# Patient Record
Sex: Female | Born: 1940 | Race: Black or African American | Hispanic: No | Smoking: Never smoker
Health system: Southern US, Academic
[De-identification: ages and names within clinical notes are randomized; demographics above are authoritative.]

## PROBLEM LIST (undated history)

## (undated) DIAGNOSIS — I10 Essential (primary) hypertension: Secondary | ICD-10-CM

## (undated) DIAGNOSIS — E559 Vitamin D deficiency, unspecified: Secondary | ICD-10-CM

## (undated) DIAGNOSIS — E0590 Thyrotoxicosis, unspecified without thyrotoxic crisis or storm: Secondary | ICD-10-CM

## (undated) HISTORY — PX: HX HYSTERECTOMY: SHX81

## (undated) HISTORY — PX: HX GALL BLADDER SURGERY/CHOLE: SHX55

## (undated) HISTORY — DX: Vitamin D deficiency, unspecified: E55.9

## (undated) HISTORY — DX: Thyrotoxicosis, unspecified without thyrotoxic crisis or storm: E05.90

## (undated) HISTORY — DX: Essential (primary) hypertension: I10

---

## 1992-04-01 ENCOUNTER — Other Ambulatory Visit (HOSPITAL_COMMUNITY): Payer: Self-pay

## 2012-11-19 IMAGING — MG MAMMO DIGITAL SCREENING
1 series · 8 of 8 positions shown · non-contrast
Comparison: 

MAMMO DIGITAL SCREENING WITH CAD
Exam:  

Bilateral digital screening mammogram with CAD
INDICATION: Annual screening.

[R CC · right · 8 of 14 slices shown]
[im 1/14]
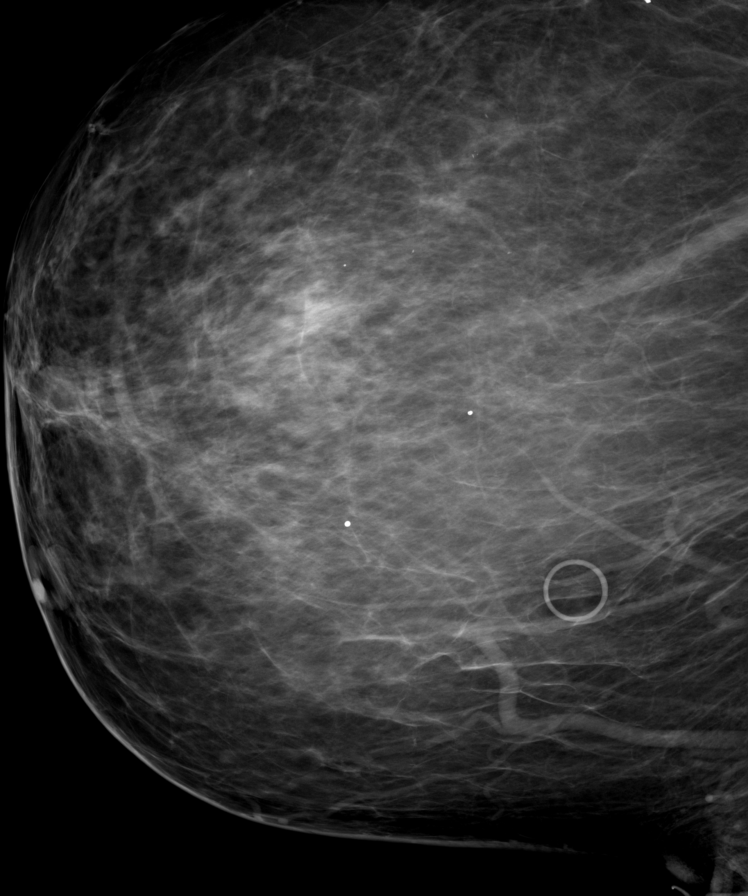
[im 2/14]
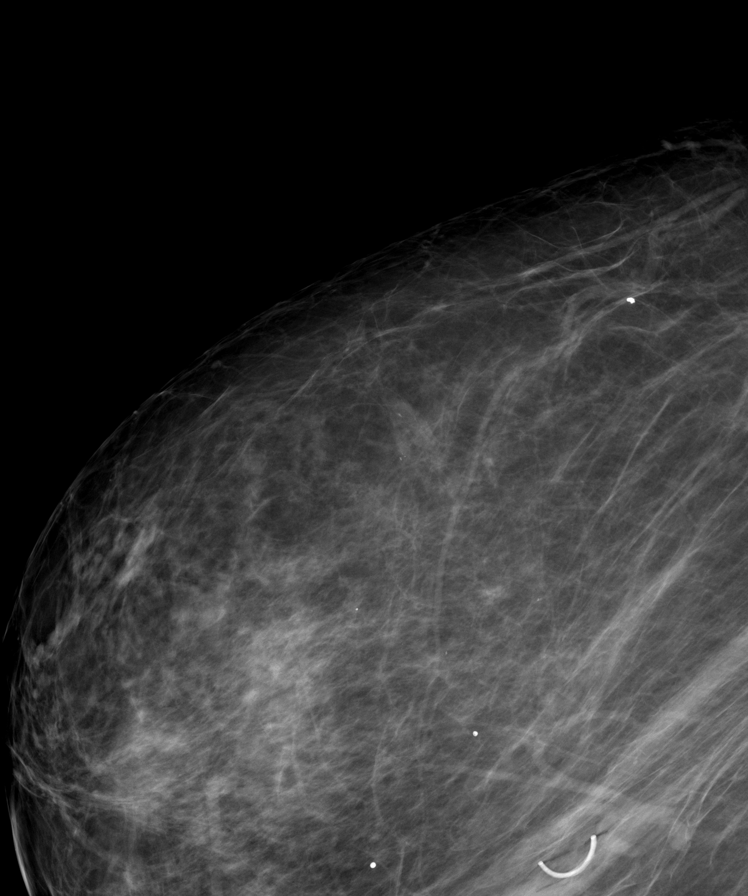
[im 4/14]
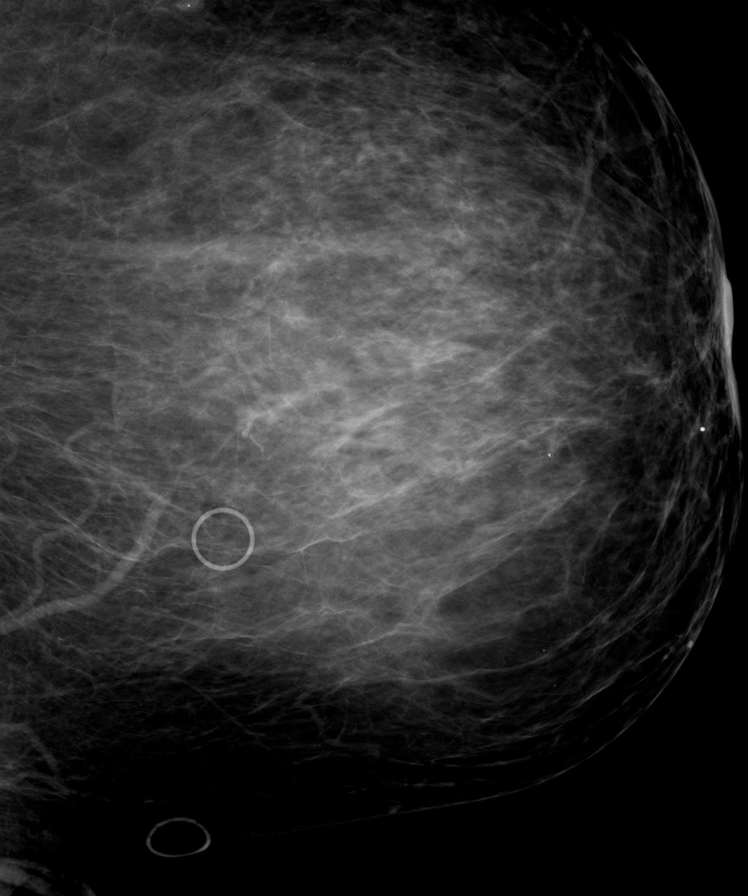
[im 6/14]
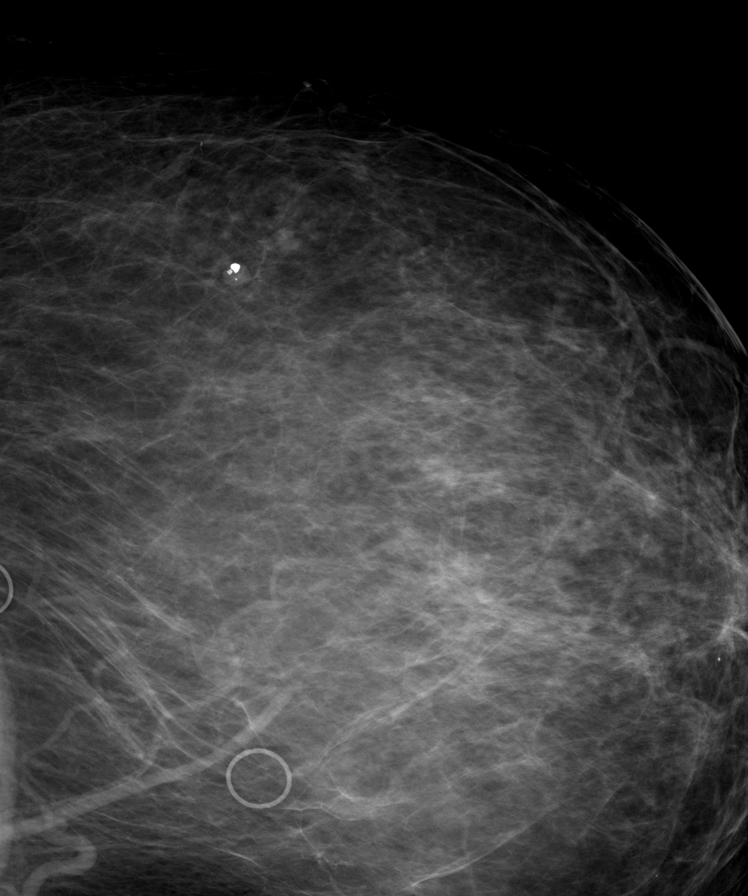
[im 8/14]
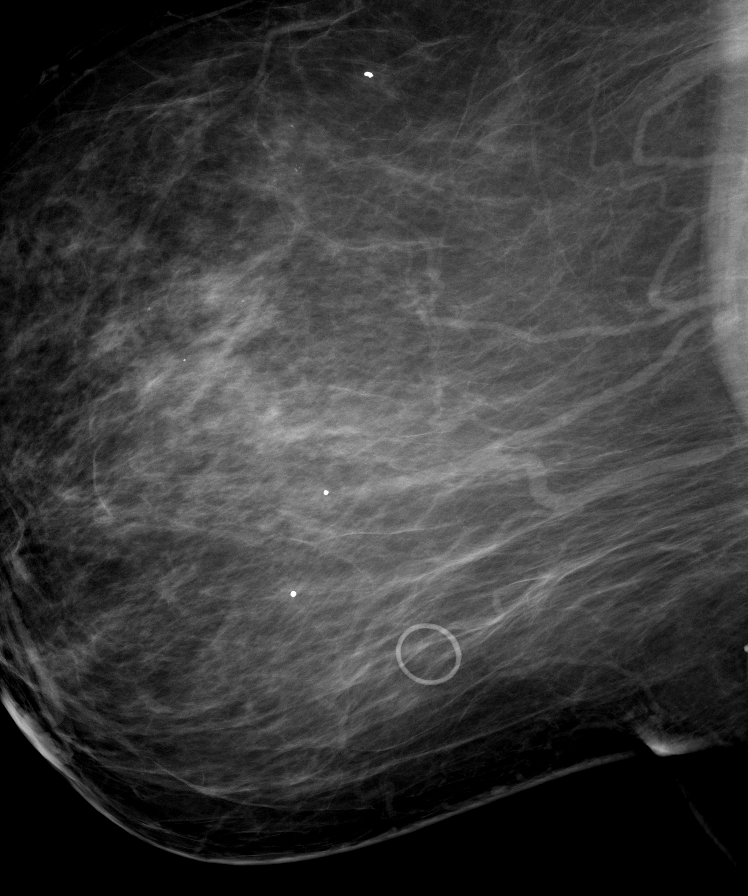
[im 10/14]
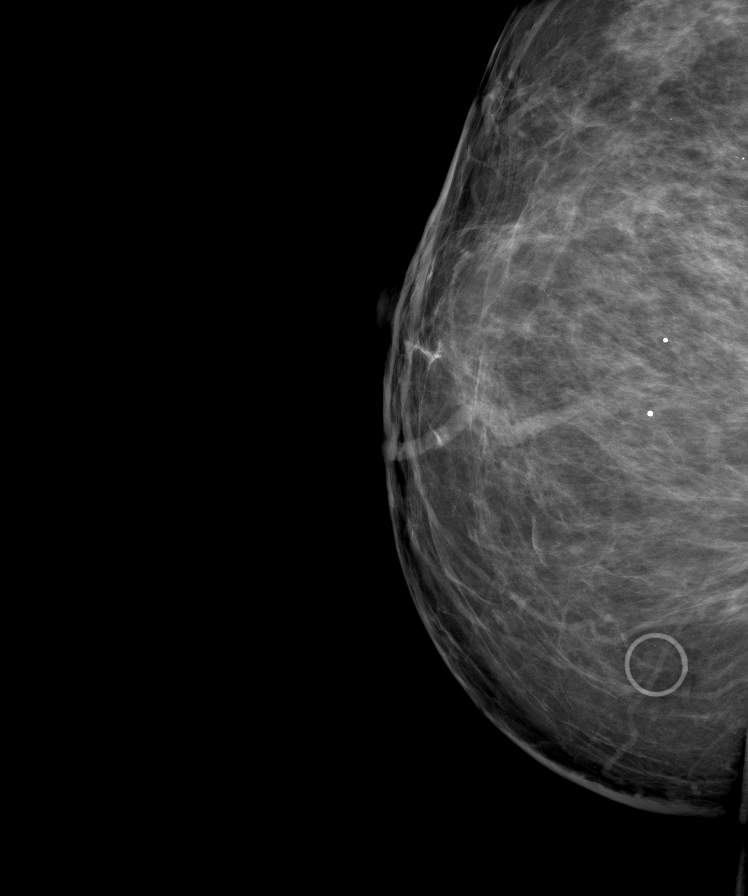
[im 12/14]
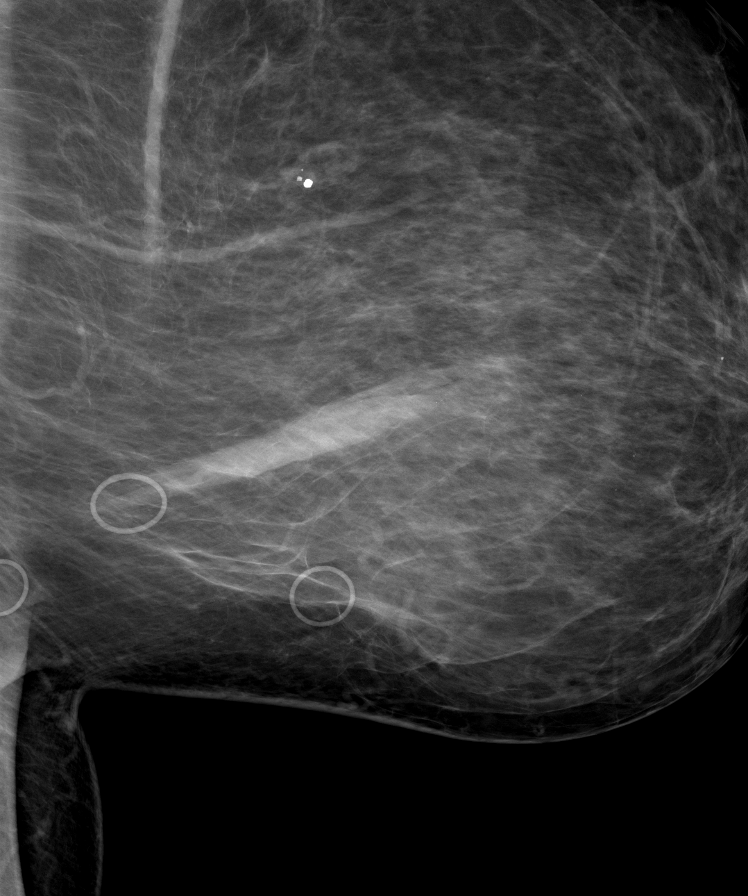
[im 14/14]
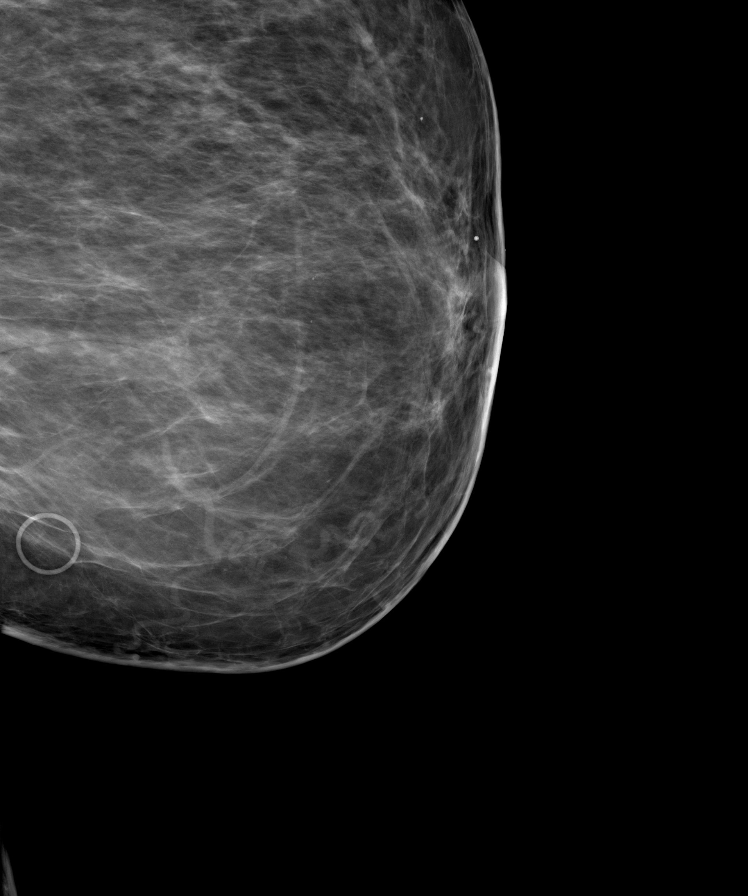

[8 of 8 positions shown; findings below may reference images not displayed]

FINDINGS: Breast parenchyma is predominantly fatty. There is no mass or suspicious cluster of microcalcifications. There is no architectural distortion, skin thickening or nipple retraction.
IMPRESSION: 1.
Bi-Rads 2-Benign findings. 
RECOMMENDATIONS: Annual screening mammogram as per ACR guidelines is recommended.  

Final Assessment Code:
Bi-Rads 2 

BI-RADS 0
Need additional imaging evaluation
BI-RADS 1
Negative mammogram
BI-RADS 2
Benign finding
BI-RADS 3
Probably benign finding: short-interval follow-up suggested
BI-RADS 4
Suspicious abnormality:  biopsy should be considered
BI-RADS 5
Highly suggestive of malignancy; appropriate action should be taken
BI-RADS 6
Known Biopsy-proven Malignancy  Appropriate action should be taken
NOTE:
In compliance with Federal regulations, the results of this mammogram are being sent to the patient.

## 2015-08-19 DIAGNOSIS — I722 Aneurysm of renal artery: Secondary | ICD-10-CM

## 2017-08-27 IMAGING — MG 3D SCREENING MAMMO BIL W/CAD
3 of 5 series · 6 of 16 positions shown · non-contrast
Comparison: 08/16/2016

------------- REPORT [id number] -------------
PATIENT REGISTRATION FORM

Scheduled Modality:
MG
Date Scheduled:
Referring Physician: 
ONESIMO ROHRER,FNP   Phone: [phone]
PATIENT INFORMATION
 Mr.
 Mrs.
 Miss  Ms.
Email address:
Social Security:  
Age:
Sex:
F
Mailing Address:
Home Phone
Cell Phone 
Study Type:
Diagnosis / Symptoms:
Insurance Authorization:
PREV, RONTA, PT HAS ORDER, MCARE
Notes/Special Instructions: 
INSURANCE INFORMATION
(Please give your insurance card to the receptionist.)
Name of  primary insurance
[employer] [place]
Subscriber’s S.S. #
Group #
Insurance ID # 
Co-payment:
$
Patient’s relationship to subscriber:
 Self
 Spouse
 Child
 Other
Name of secondary insurance (if applicable):
Insurance ID #
IN CASE OF EMERGENCY
Name of friend or relative to call in case of emergency:
Relationship to patient:
Home phone #
Work phone #
[employer] SAME #
The above information is true to the best of my knowledge. I authorize my insurance benefits be paid directly to the physician. I understand that I am financially responsible for any balance. I also authorize [employer] or insurance company to release any information required to process my claims.
Patient/Guardian signature
Date
------------- REPORT [id number] -------------
EXAM:  3D BILATERAL ANNUAL SCREENING DIGITAL MAMMOGRAM WITH TOMOSYNTHESIS AND CAD
INDICATION: Screening.

[Series 6347: R CC · right · 0.10mm/px · 4 of 5 slices shown]
[im 1/5]
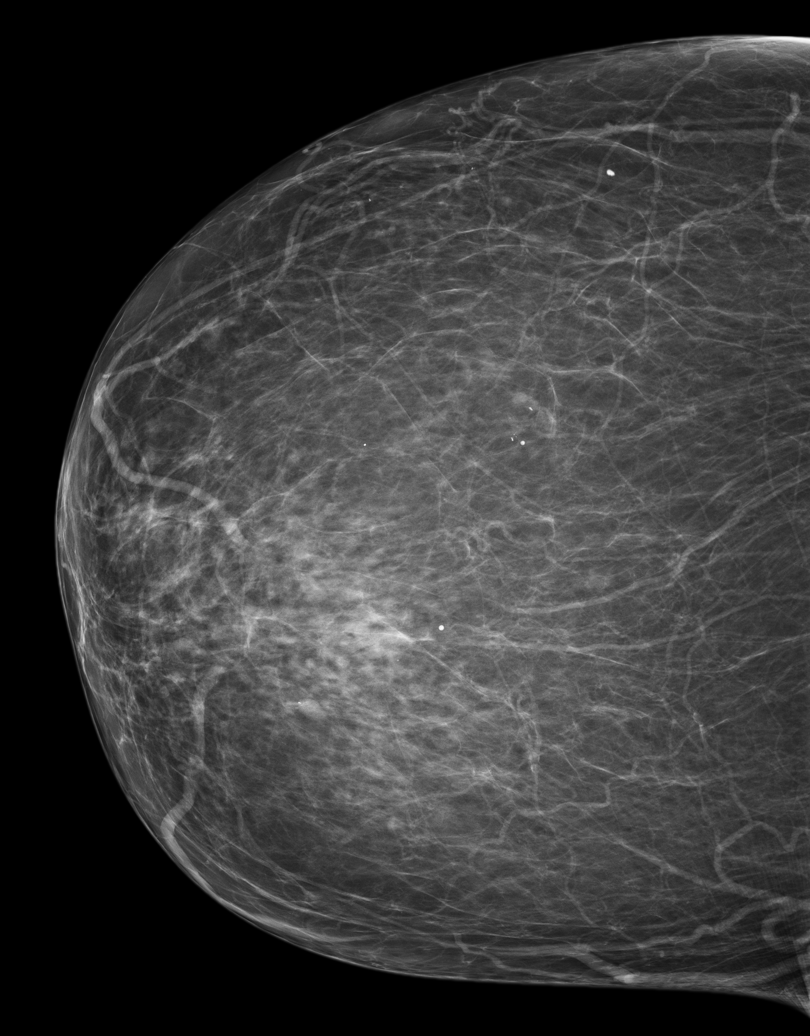
[im 2/5]
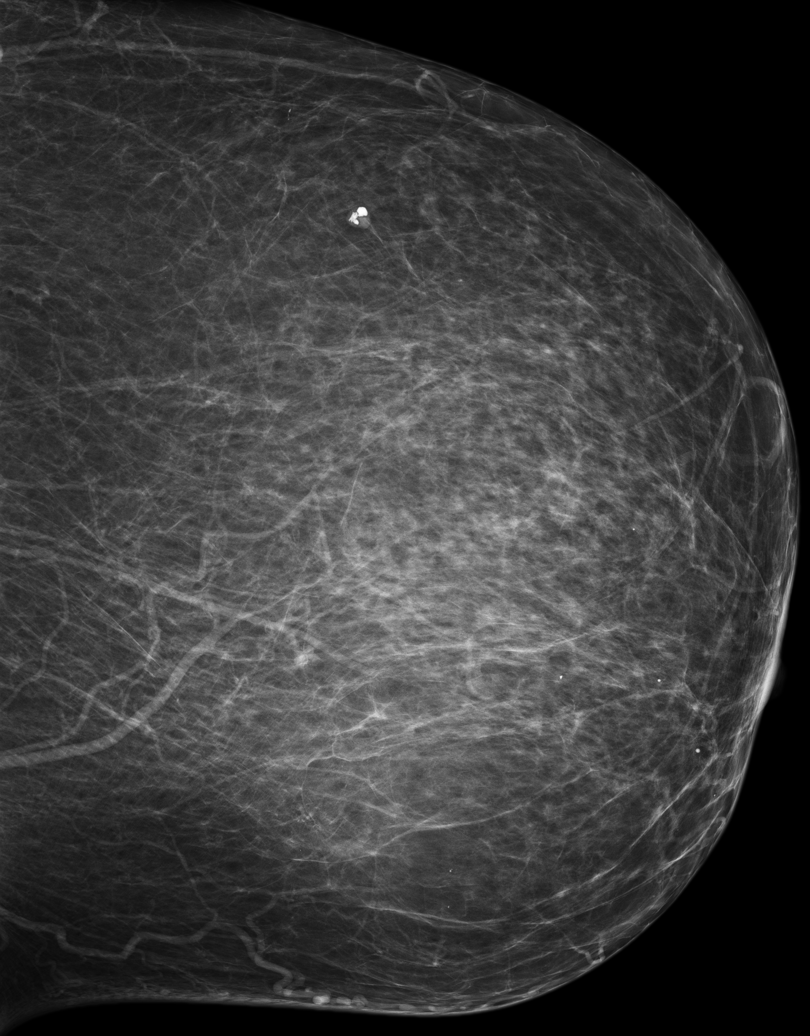
[im 3/5]
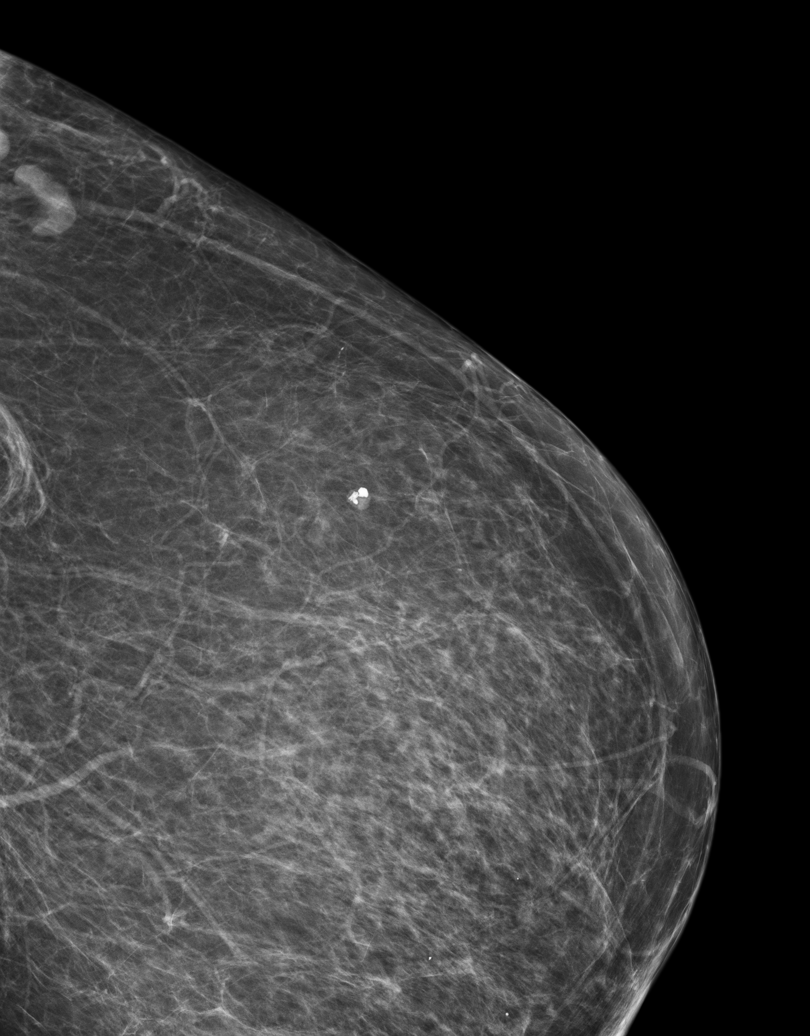
[im 5/5]
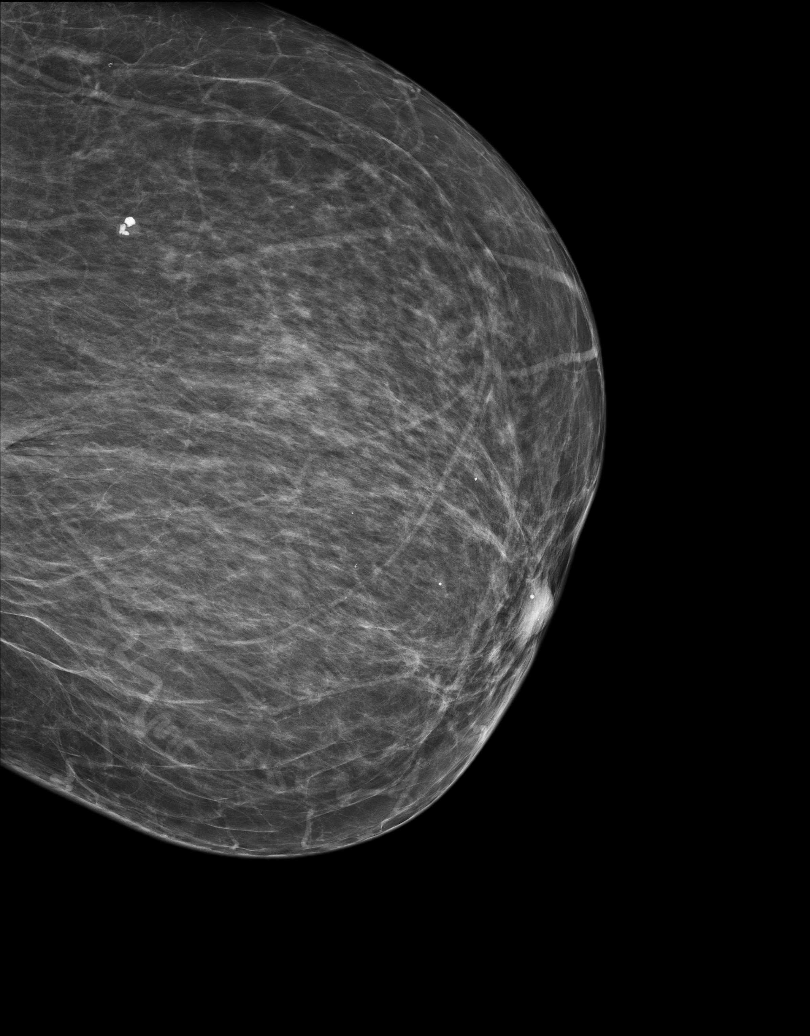

[R]
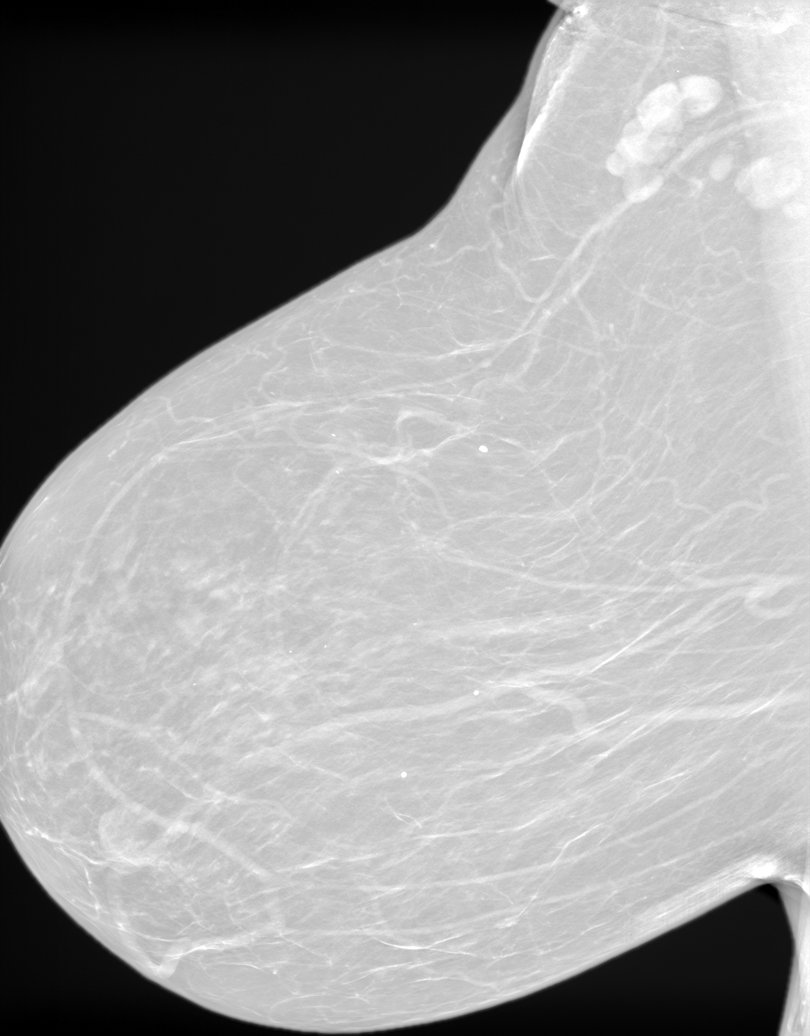

[L]
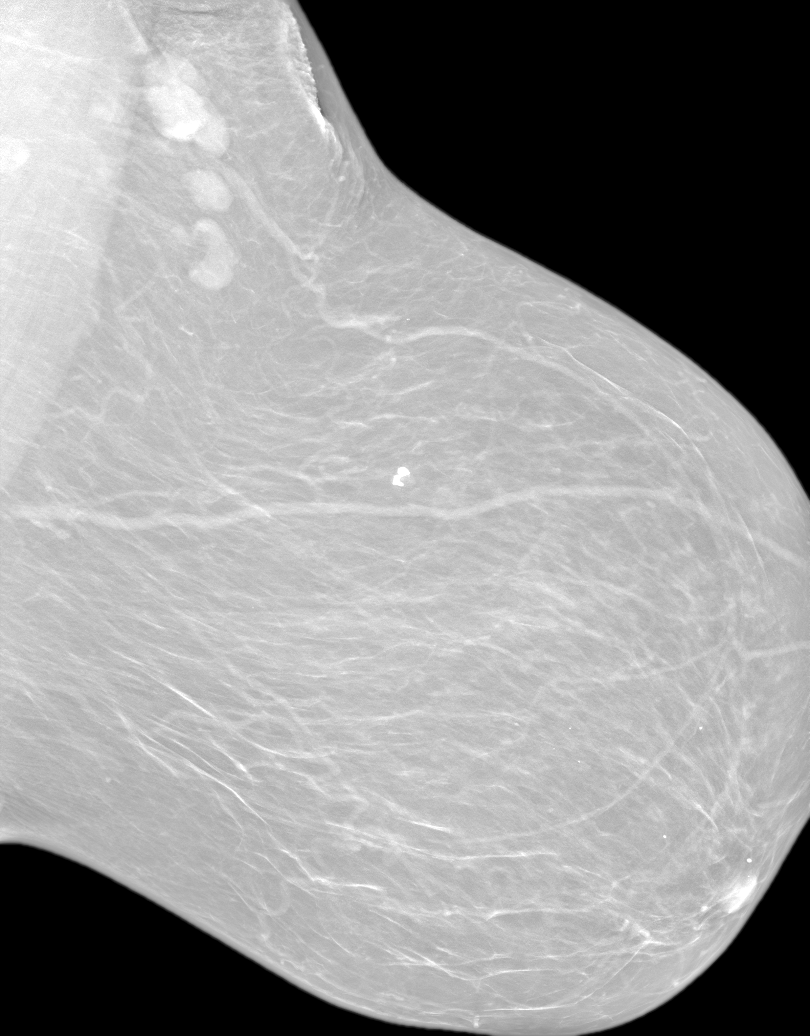

[6 of 16 positions shown; findings below may reference images not displayed]

FINDINGS: A screening mammogram is performed.  The current exam is compared with an earlier study dated 08/16/2016.  There is involutional changes consisting of fatty replacement of the glandular tissue bilaterally.  There are coarse benign calcifications bilaterally.  3D tomosynthesis shows no evidence for an occult mass or calcifications.  There are numerous lymph nodes noted in both axillary areas.
IMPRESSION: 1.  Normal stable screening mammogram. 

2.  ACR BREAST DENSITY:  B (Scattered areas of fibroglandular density). 

3.  BIRADS 2-Benign findings. Patient has been added in a reminder system with a target date for the next screening mammography.

4.  Management recommendation:  Routine mammographic screening.   

Final Assessment Code:

Bi-Rads 2 

BI-RADS 0
Need additional imaging evaluation

BI-RADS 1
Negative mammogram

BI-RADS 2
Benign finding

BI-RADS 3
Probably benign finding: short-interval follow-up suggested

BI-RADS 4
Suspicious abnormality:  biopsy should be considered

BI-RADS 5
Highly suggestive of malignancy; appropriate action should be taken

BI-RADS 6
Known Biopsy-proven Malignancy – Appropriate action should be taken

NOTE:
In compliance with Federal regulations, the results of this mammogram are being sent to the patient.

------------- REPORT [id number] -------------
[facility]
[address]
We wish to report the following on your recent mammography examination. We are sending a report to your referring physician or other health care provider. 
(       Normal/Negative:
No evidence of cancer.
This statement is mandated by the [place], Department of Health.
Your examination was performed by one of our technologists, who are registered radiological technologists and also specially certified in mammography:
___
Frank, Zarina (M)
___
Huidrom, Rutik (M)

Your mammogram was interpreted by our radiologist.

( 
Jean Rousso Aniece, M.D.

(Annual Breast Examination by a physician or other health care provider
(Annual Mammography Screening beginning at age 40
(Monthly Breast Self Examination

## 2019-12-18 IMAGING — MR MRI JOINT UPPER EXTREMITY WITHOUT CONTRAST RT
8 series · 40 of 40 positions shown · IV contrast (gadolinium)
Comparison: None.

﻿EXAM:  MRI JOINT UPPER EXTREMITY WITHOUT CONTRAST RT SHOULDER
INDICATION: Severe right shoulder pain.
TECHNIQUE: Multiplanar, multisequential MRI of the right shoulder joint was performed without gadolinium contrast.

[Series 4: axial shim · axial · right · 10.0mm · 3.12mm/px · z∈[-60,+60]mm · 4 of 13 slices shown (1 of 2)]
[im 1/13]
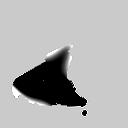
[im 5/13]
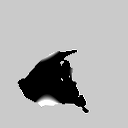
[im 9/13]
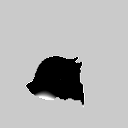
[im 13/13]
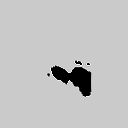

[Series 5: axial shim · axial · right · 10.0mm · 3.12mm/px · z∈[-60,+60]mm · 4 of 13 slices shown (2 of 2)]
[im 1/13]
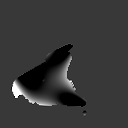
[im 5/13]
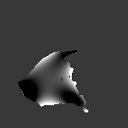
[im 9/13]
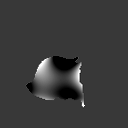
[im 13/13]
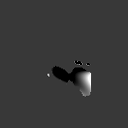

[Series 6: T1 · oblique · right · 3.5mm · 0.38mm/px · 5 of 18 slices shown]
[im 1/18]
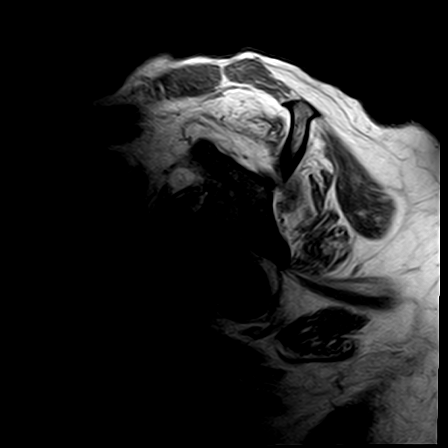
[im 5/18]
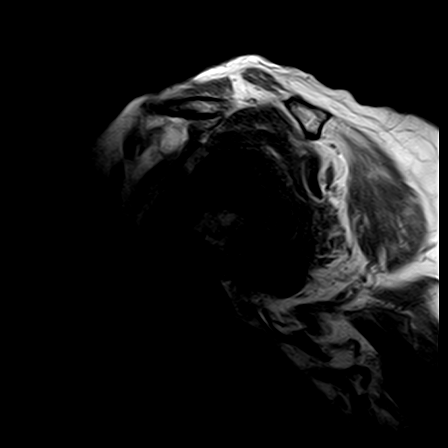
[im 9/18]
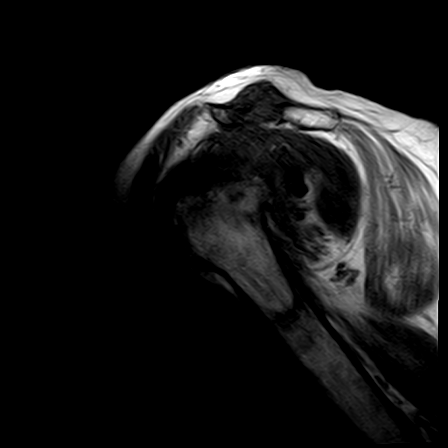
[im 13/18]
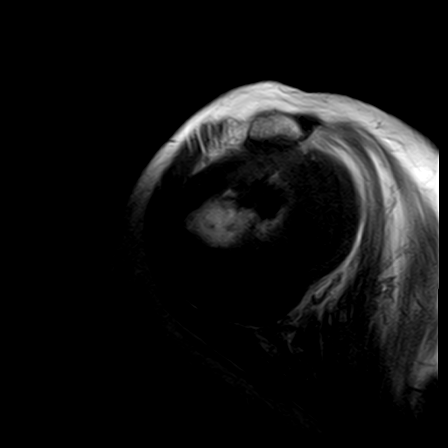
[im 18/18]
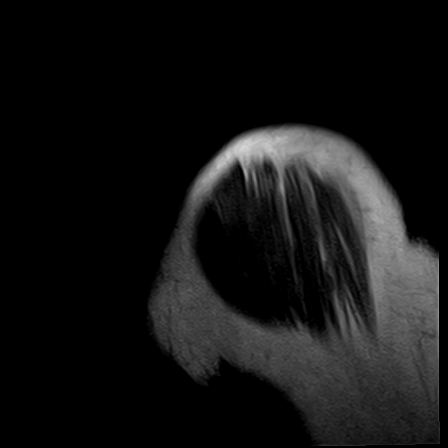

[Series 7: STIR · oblique · right · 3.5mm · 0.47mm/px · 5 of 18 slices shown (1 of 2)]
[im 1/18]
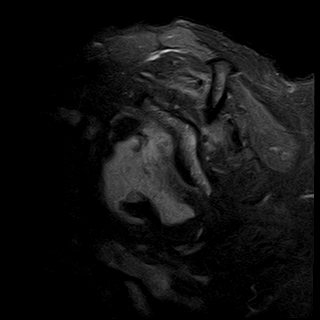
[im 5/18]
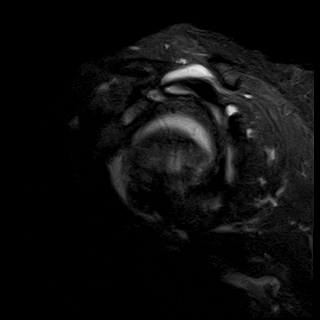
[im 9/18]
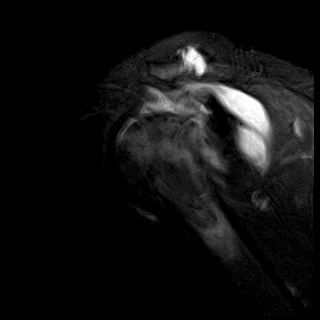
[im 13/18]
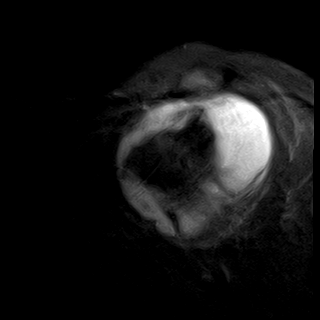
[im 18/18]
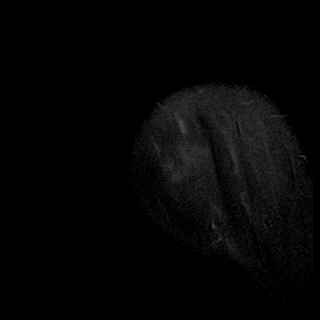

[Series 8: PD fat-sat · axial · right · 4.0mm · 0.56mm/px · z∈[-33,+44]mm · 5 of 18 slices shown (1 of 2)]
[im 1/18]
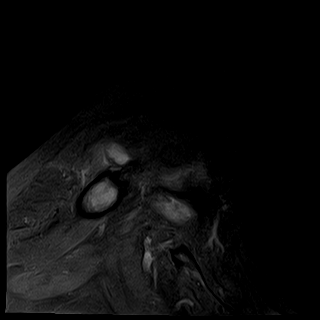
[im 5/18]
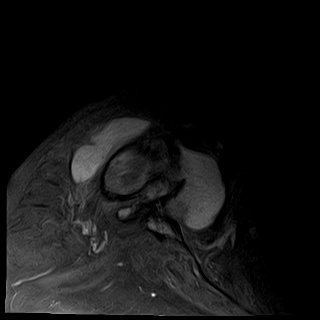
[im 9/18]
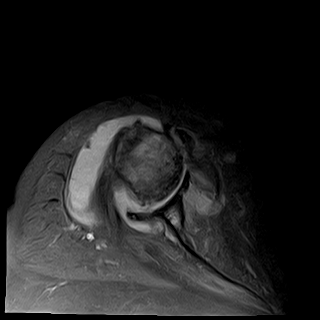
[im 13/18]
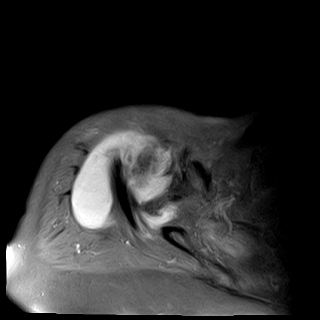
[im 18/18]
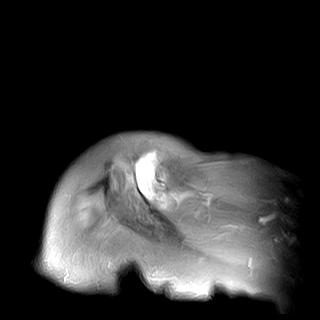

[Series 9: T2 fat-sat · axial · right · 4.0mm · 0.42mm/px · z∈[-46,+57]mm · 7 of 24 slices shown]
[im 1/24]
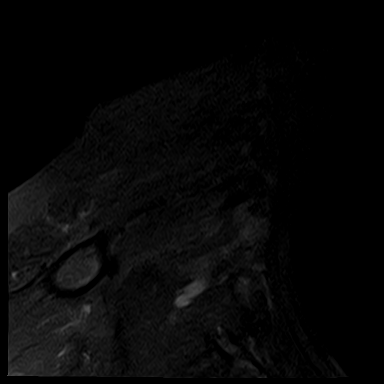
[im 4/24]
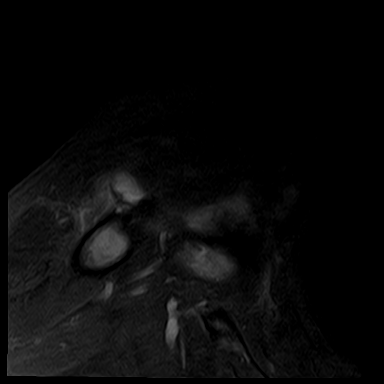
[im 8/24]
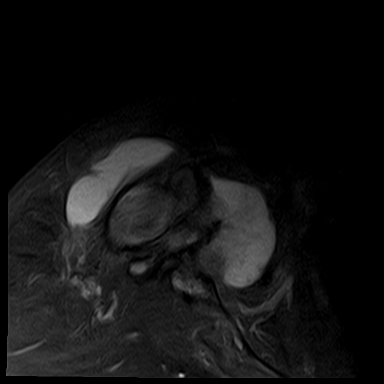
[im 12/24]
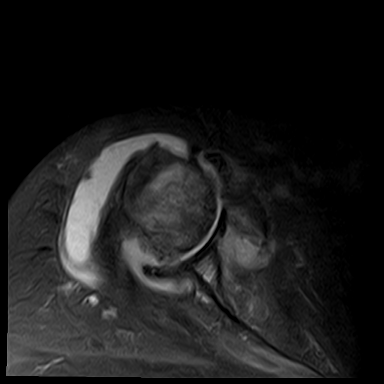
[im 16/24]
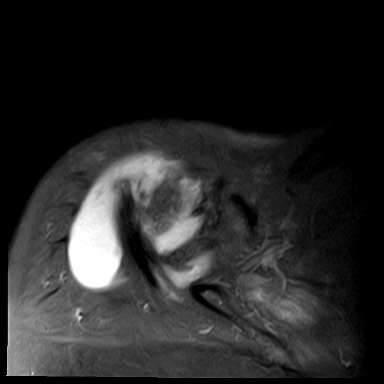
[im 20/24]
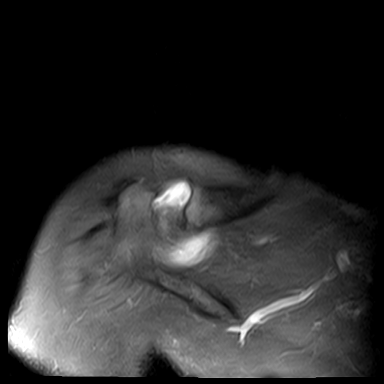
[im 24/24]
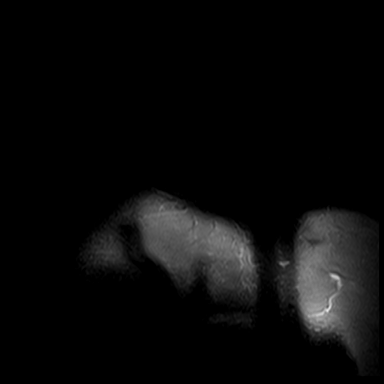

[Series 10: PD fat-sat · oblique · right · 4.0mm · 0.53mm/px · 5 of 18 slices shown (2 of 2)]
[im 1/18]
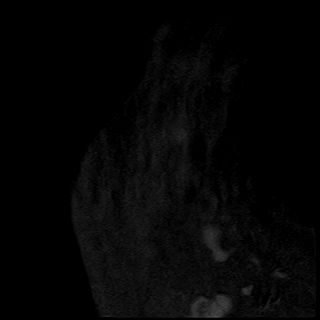
[im 5/18]
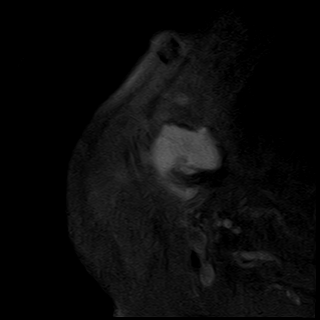
[im 9/18]
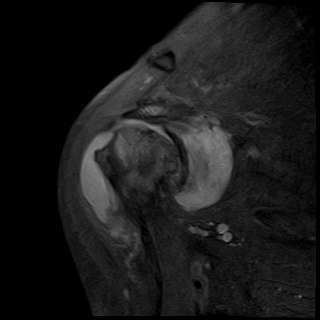
[im 13/18]
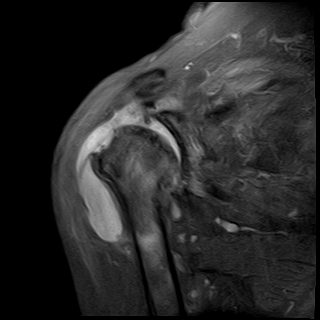
[im 18/18]
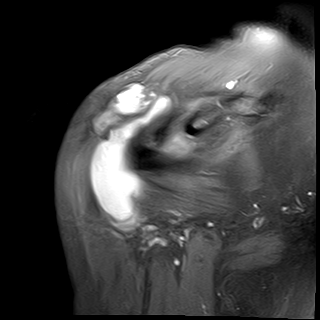

[Series 11: STIR · oblique · right · 4.0mm · 0.53mm/px · 5 of 18 slices shown (2 of 2)]
[im 1/18]
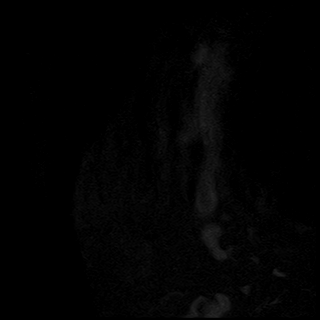
[im 5/18]
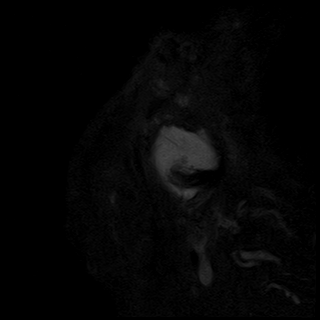
[im 9/18]
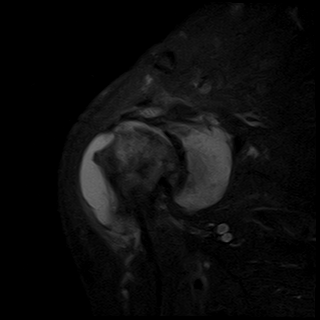
[im 13/18]
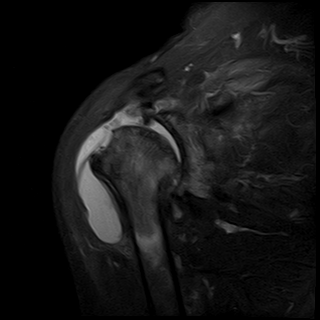
[im 18/18]
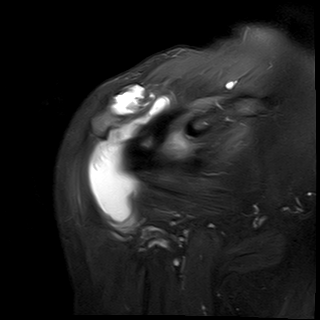

[40 of 40 positions shown; findings below may reference images not displayed]

FINDINGS: Examination is significantly degrade due to excessive patient motion.  There are full thickness supraspinatus, infraspinatus and subscapularis tendon tears.  Long head of the biceps tendon is also not well seen.  There is complete glenohumeral articular cartilage loss.  Extensive superior and posterior labral tears are also noted.  There is extensive edema within the humeral head with suggestion of a nondisplaced proximal humeral shaft fracture.  There is moderate acromioclavicular joint osteoarthritis.  There is a large glenohumeral joint effusion.  No mass is seen along the course of the suprascapular nerve, within the spinoglenoid notch or within the quadrilateral space.
IMPRESSION: 1. Significantly degrade exam due to excessive patient motion.  Large full thickness supraspinatus, infraspinatus and subscapularis tendon tears.  

2. Long head of the biceps tendon not well seen and may be chronically torn and retracted.  

3. Extensive superior and posterior labral tears with near complete glenohumeral articular cartilage loss.

4. Extensive edema within the humeral head with suggestion of a nondisplaced proximal humeral shaft fracture.  

5. Moderate acromioclavicular joint osteoarthritis.

## 2020-10-07 IMAGING — CT CT HEAD W/O CONTRAST
2 series · 16 of 30 positions shown, 20 images · non-contrast
Comparison: None available.

﻿EXAM:  CT HEAD W/O CONTRAST
INDICATION: Headaches and left-sided weakness.
TECHNIQUE: Axial noncontrast CT imaging was performed from skull base to the vertex. Exam was performed using 1 or more of the following dose reduction techniques: Automated exposure control, adjustment of the mA and/or kV according to patient size, or the use of iterative reconstruction technique.

[ax · axial · 0.42mm/px · z∈[-138,-2]mm · 13 of 80 slices shown, 17 images]
[im 6/80  brain]
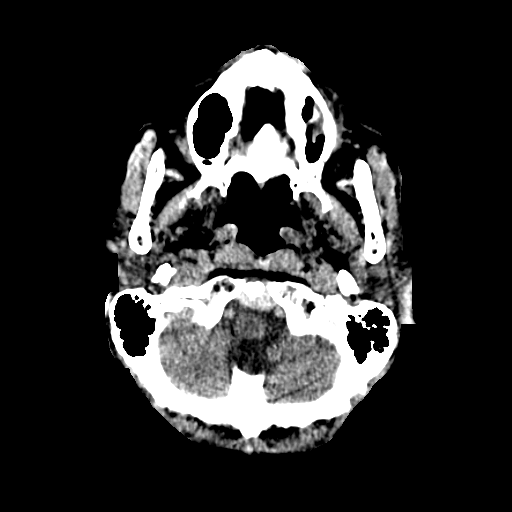
[im 6/80  bone]
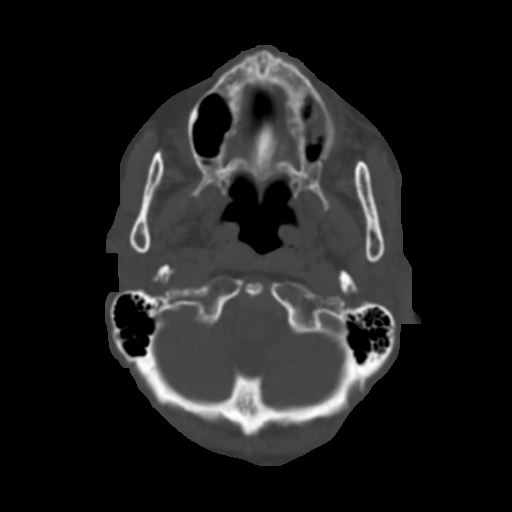
[im 12/80  brain]
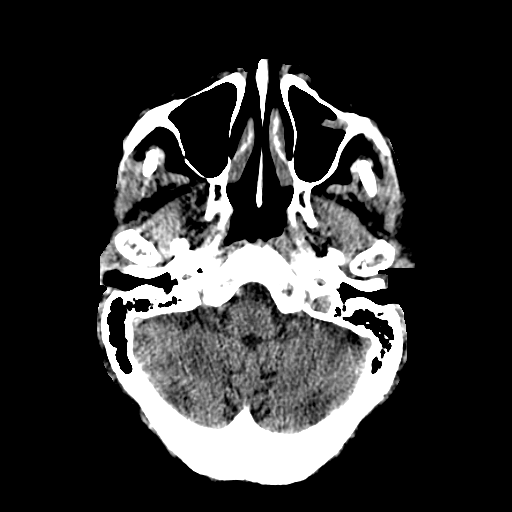
[im 17/80  brain]
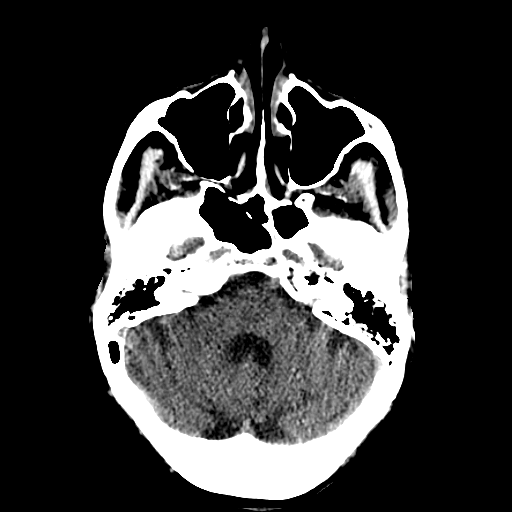
[im 23/80  brain]
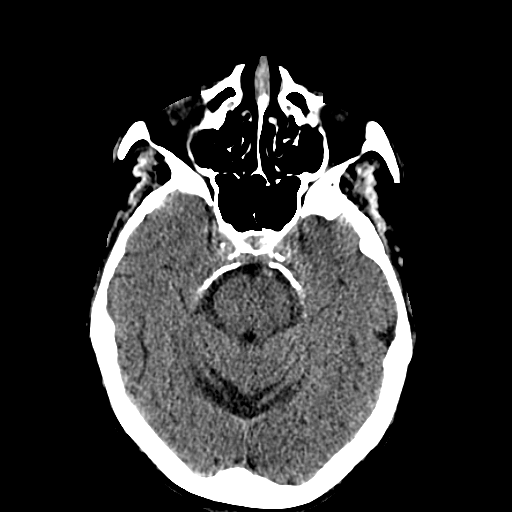
[im 29/80  brain]
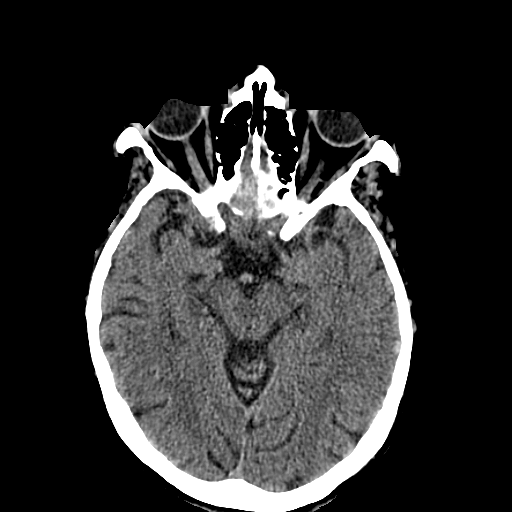
[im 29/80  bone]
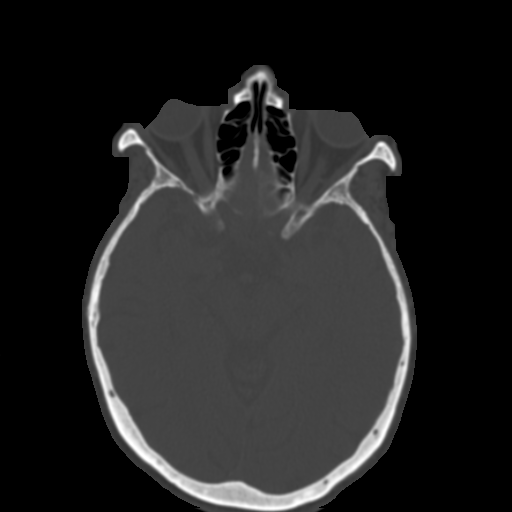
[im 34/80  brain]
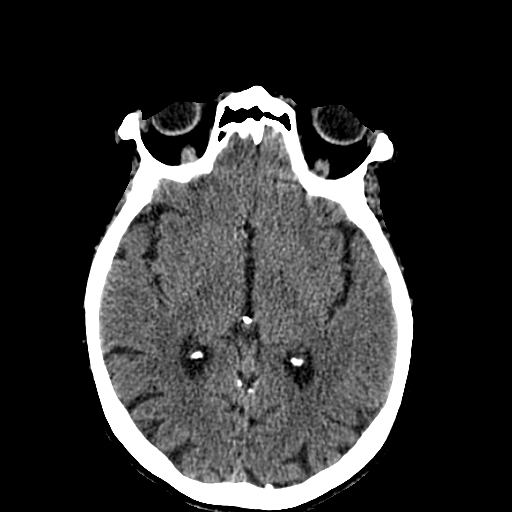
[im 40/80  brain]
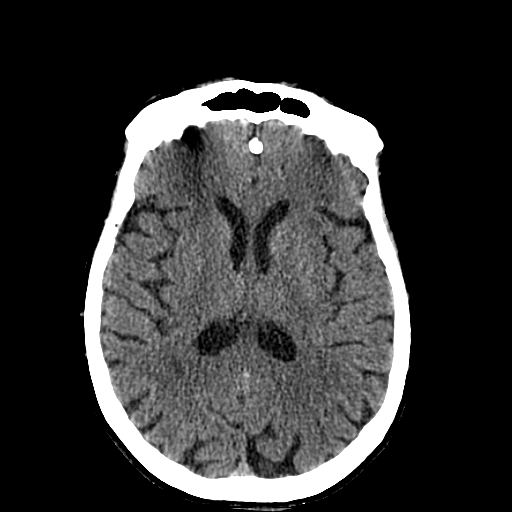
[im 46/80  brain]
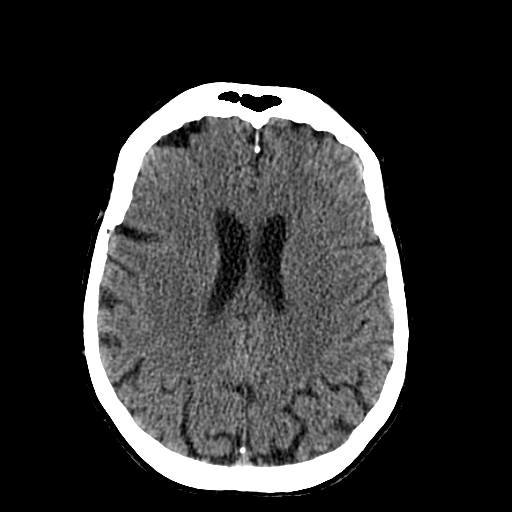
[im 51/80  brain]
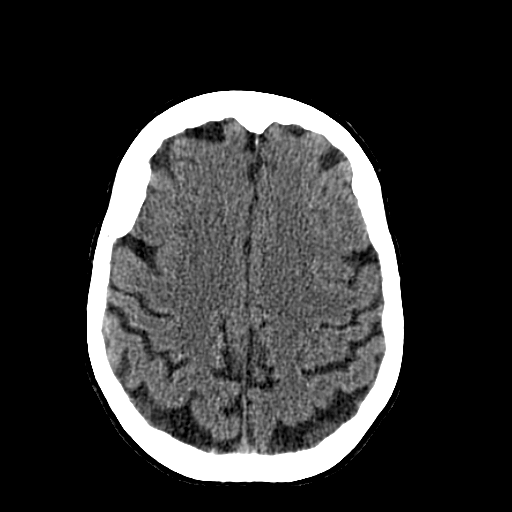
[im 51/80  bone]
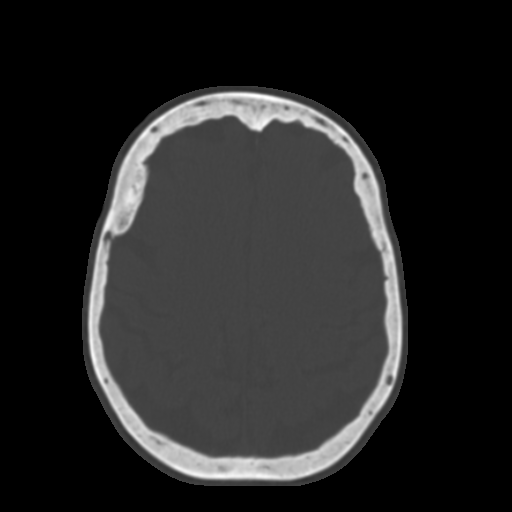
[im 57/80  brain]
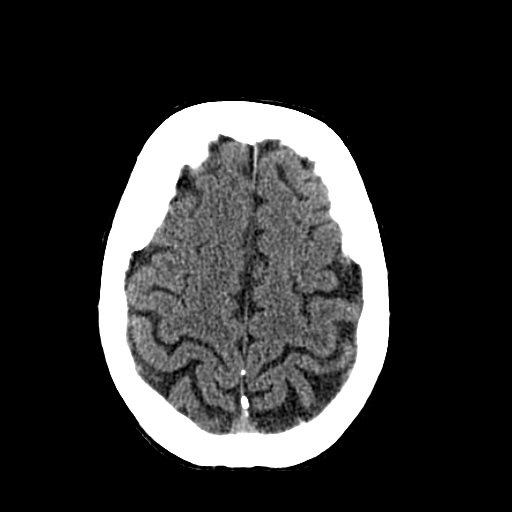
[im 63/80  brain]
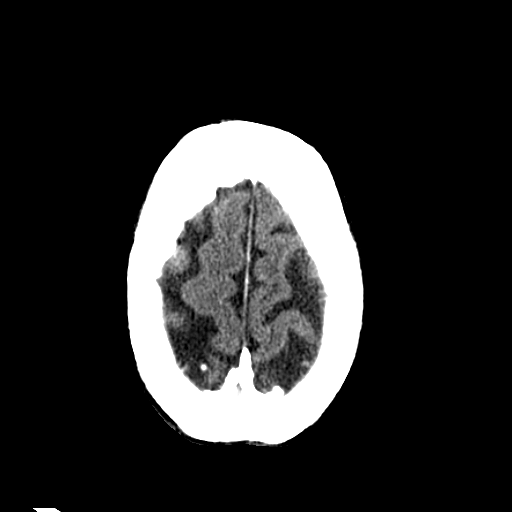
[im 68/80  brain]
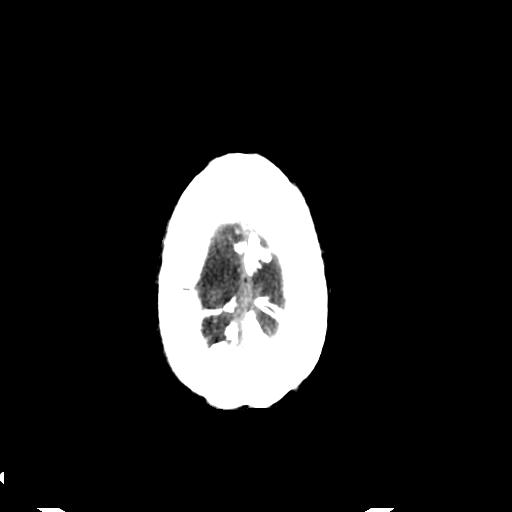
[im 74/80  brain]
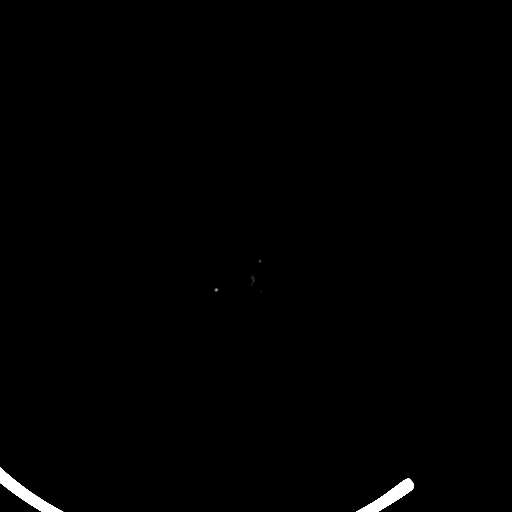
[im 74/80  bone]
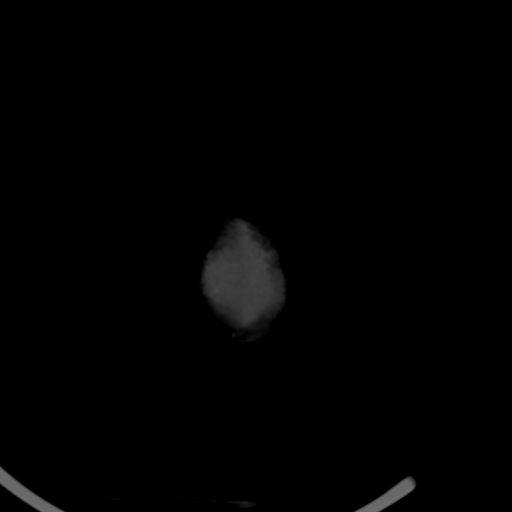

[bone · axial · 0.47mm/px · z∈[-150,-104]mm · 3 of 80 slices shown]
[im 6/80  bone]
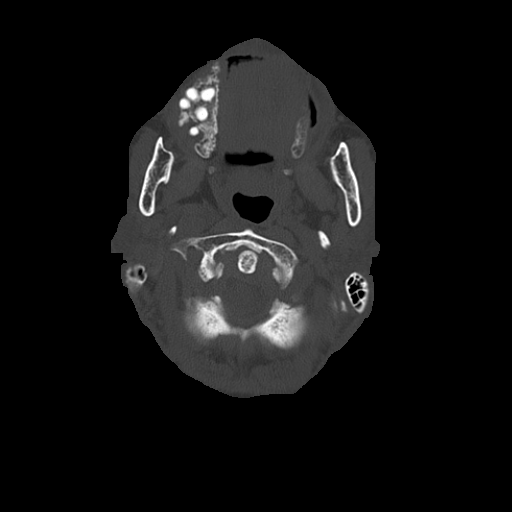
[im 17/80  bone]
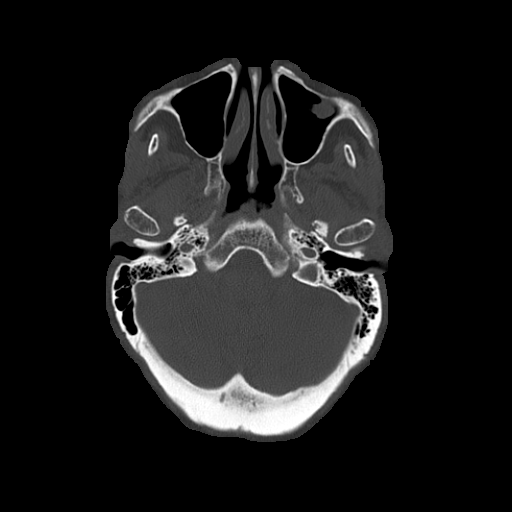
[im 29/80  bone]
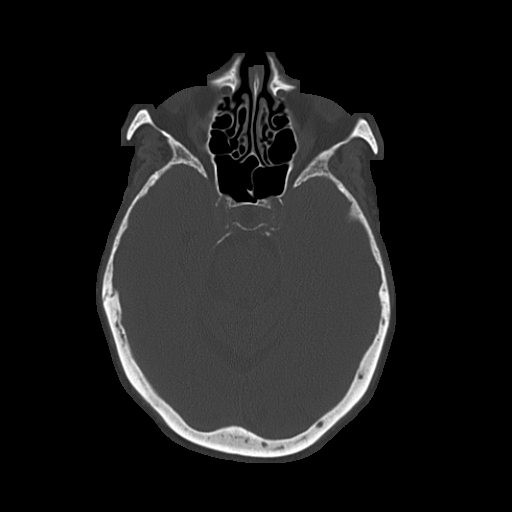

[16 of 30 positions shown; findings below may reference images not displayed]

FINDINGS: The cortical sulci and ventricular system is appropriate in size for the patient's age. There are no focal areas of abnormal attenuation within the brain parenchyma. There is no mass effect, midline shift or intracranial hemorrhage. There is no evidence of acute infarction. There are no extra-axial fluid collections. Visualized paranasal sinuses, mastoid air cells and orbital contents are also unremarkable.
IMPRESSION: Unremarkable exam.

## 2021-02-22 IMAGING — MR MRI LUMBAR SPINE WITHOUT CONTRAST
6 series · 48 of 48 positions shown · IV contrast (gadolinium)
Comparison: None available.

﻿EXAM:  [id number]   MRI LUMBAR SPINE WITHOUT CONTRAST
INDICATION: Chronic lower back.
TECHNIQUE: Multiplanar multisequential MRI of the lumbar spine was performed without gadolinium contrast. A non conventional body coil was utilized due to patient's extremely large body habitus.

[Series 4: T2 · sagittal · 5.5mm · 0.91mm/px · 5 of 13 slices shown (1 of 3)]
[im 1/13]
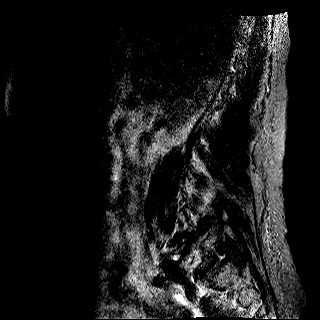
[im 4/13]
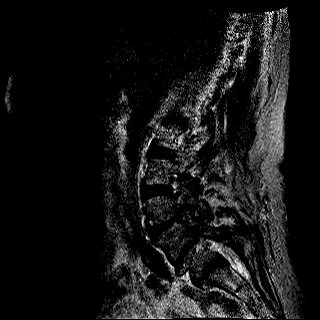
[im 7/13]
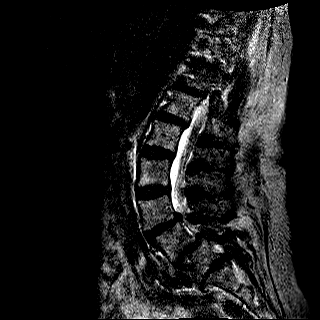
[im 10/13]
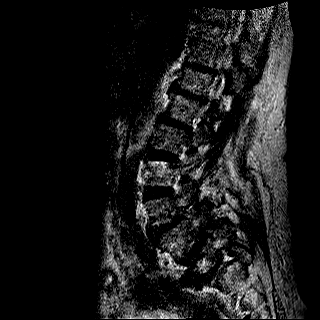
[im 13/13]
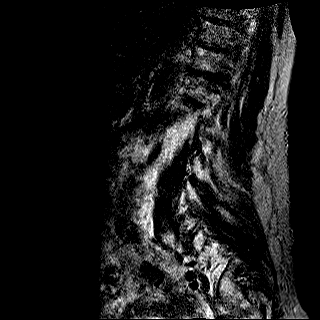

[Series 5: T1 · sagittal · 5.5mm · 0.91mm/px · 6 of 13 slices shown (1 of 2)]
[im 1/13]
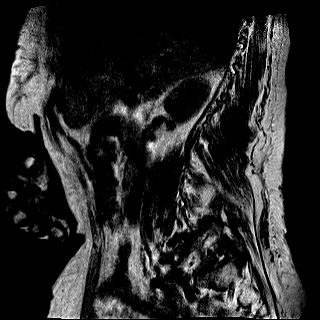
[im 3/13]
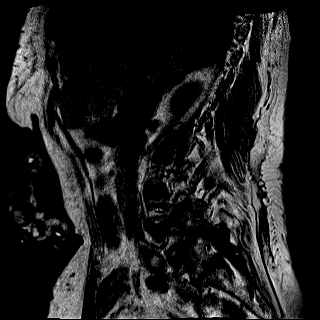
[im 5/13]
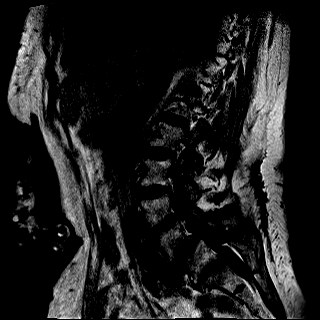
[im 8/13]
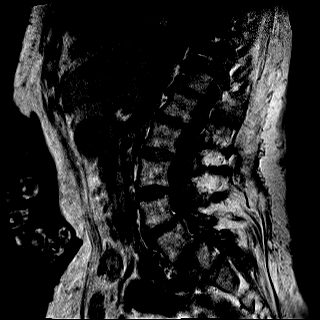
[im 10/13]
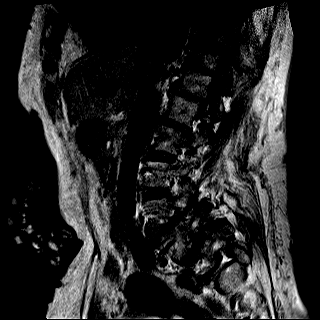
[im 13/13]
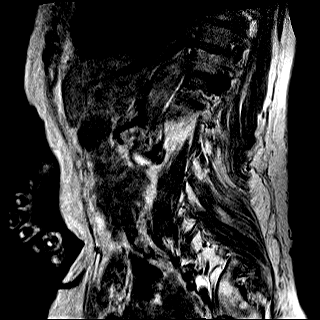

[Series 6: STIR · sagittal · 5.5mm · 1.13mm/px · 6 of 13 slices shown]
[im 1/13]
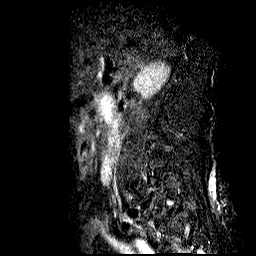
[im 3/13]
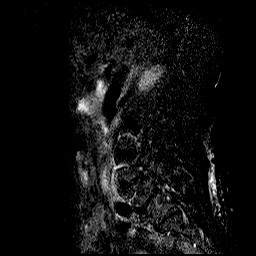
[im 5/13]
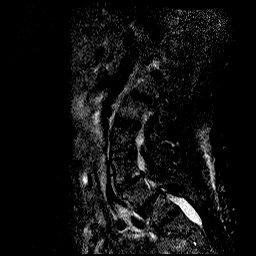
[im 8/13]
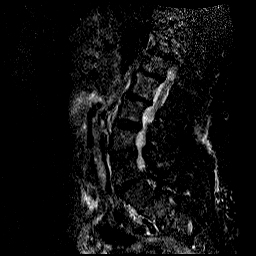
[im 10/13]
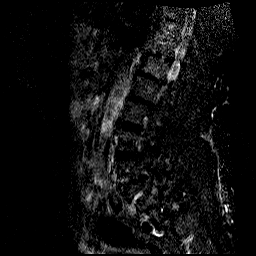
[im 13/13]
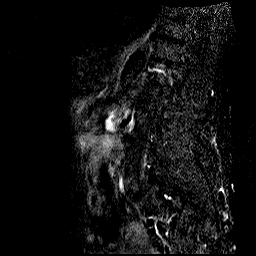

[Series 7: T2 · oblique · 5.0mm · 0.89mm/px · 11 of 25 slices shown (2 of 3)]
[im 1/25]
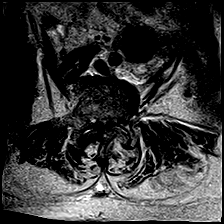
[im 3/25]
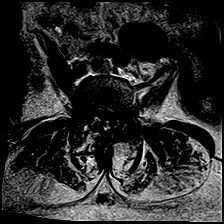
[im 5/25]
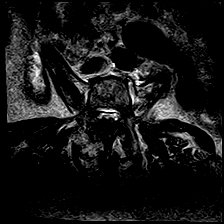
[im 8/25]
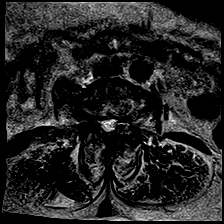
[im 10/25]
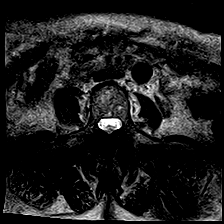
[im 13/25]
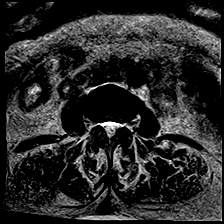
[im 15/25]
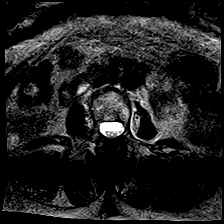
[im 17/25]
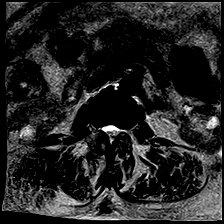
[im 20/25]
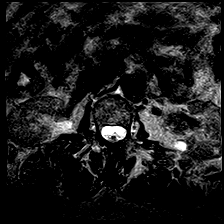
[im 22/25]
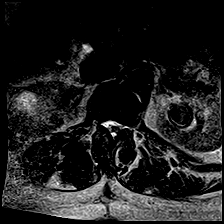
[im 25/25]
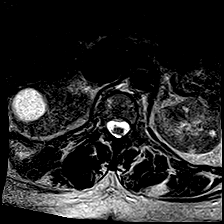

[Series 8: T1 · oblique · 5.0mm · 0.89mm/px · 11 of 25 slices shown (2 of 2)]
[im 1/25]
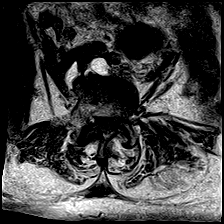
[im 3/25]
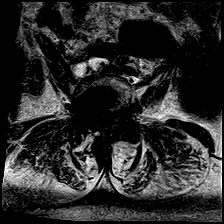
[im 5/25]
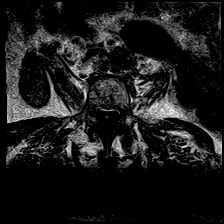
[im 8/25]
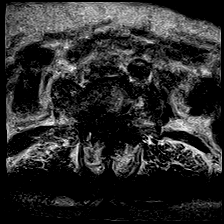
[im 10/25]
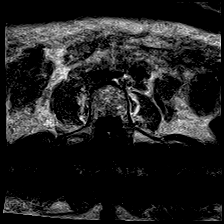
[im 13/25]
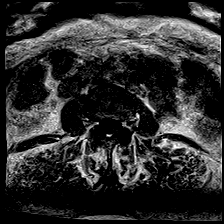
[im 15/25]
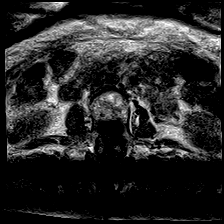
[im 17/25]
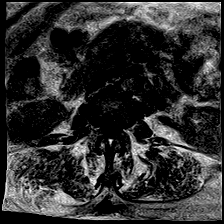
[im 20/25]
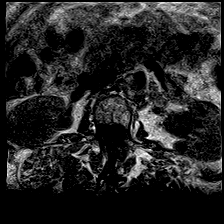
[im 22/25]
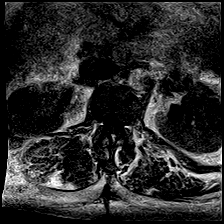
[im 25/25]
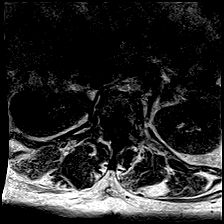

[Series 9: T2 · coronal · 4.0mm · 1.34mm/px · 9 of 20 slices shown (3 of 3)]
[im 1/20]
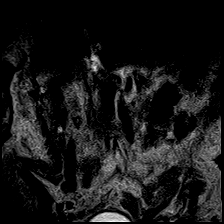
[im 3/20]
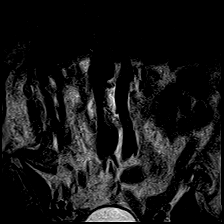
[im 5/20]
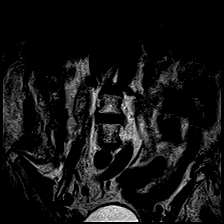
[im 8/20]
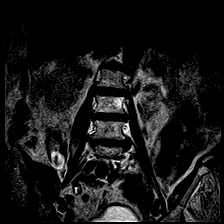
[im 10/20]
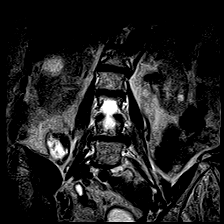
[im 12/20]
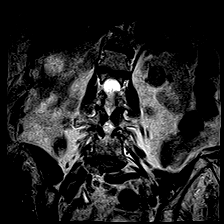
[im 15/20]
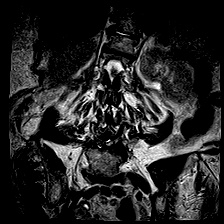
[im 17/20]
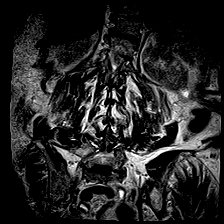
[im 20/20]
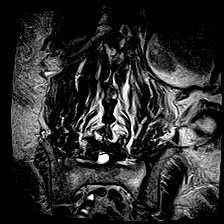

[48 of 48 positions shown; findings below may reference images not displayed]

FINDINGS: Examination is limited due to patient's large body habitus and motion. Bone marrow signal intensity is normal. There is no acute fracture or subluxation. Distal spinal cord is not well assessed. Spinal canal is congenitally narrow.

At T12-L1 level, there is minimal retrolisthesis of T12 on L1 vertebral body. There is a minimal bulging annulus, minimally effacing the ventral thecal sac. There is no significant neural foraminal stenosis.

L1-2 level is unremarkable.

At L2-3 level, there is mild bilateral neural foraminal stenosis from facet arthropathy and bulging annulus.

At L3-4 level, there is moderate disc desiccation. There is a small broad-based central disc bulge resulting in severe spinal stenosis. There is severe bilateral neural foraminal stenosis from facet arthropathy and bulging annulus.

At L4-5 level, there is marked disc desiccation. There is also grade 1 anterolisthesis of L4 on L5 vertebral body. There is a small broad-based central disc bulge resulting in severe spinal stenosis. There is severe bilateral neural foraminal stenosis from facet arthropathy and bulging annulus.

At L5-S1 level, there is a minimal bulging annulus, minimally effacing the ventral thecal sac. There is moderate to severe bilateral neural foraminal stenosis from facet arthropathy and bulging annulus.

Paraspinal soft tissues are unremarkable. There is an indeterminate 2.3 cm low T2 signal intensity mass within the left kidney interpolar region.
IMPRESSION: 1. Limited exam due to patient's large body habitus and motion. Minimal retrolisthesis of T12 on L1 vertebral body and grade 1 anterolisthesis of L4 on L5 vertebral body. 

2. Severe spinal stenosis at L3-4 and L4-5 levels from small central disc bulges. 

3. Multilevel neural foraminal stenosis as detailed above. 

4. Indeterminate 2.3 cm low T2 signal intensity mass within the left kidney interpolar region. Follow-up contrast-enhanced CT scan is recommended for better assessment.

## 2021-04-14 IMAGING — CT CT ABDOMEN WITH CONTRAST
2 of 4 series · 17 of 46 positions shown, 19 images · IV contrast (CONTRAST)
Comparison: MRI lumbar spine dated 02/22/2021.

﻿EXAM:  CT ABDOMEN WITH CONTRAST
INDICATION: Suspected left renal lesion.
TECHNIQUE: Axial CT imaging of the abdomen was performed following intravenous administration of 100 mL of Omnipaque 300. Oral contrast was also administered. Images were reviewed in multiple windows and projections. Exam was performed using 1 or more of the following dose reduction techniques: Automated exposure control, adjustment of the mA and/or kV according to patient size, or the use of iterative reconstruction technique.

[cor · coronal · 0.84mm/px · 3 of 109 slices shown]
[im 37/109  soft-tissue]
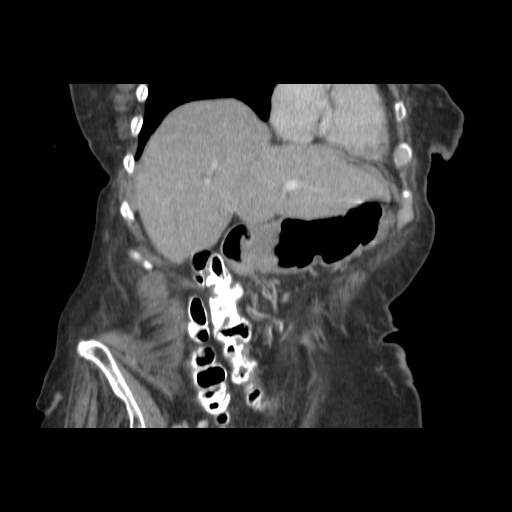
[im 49/109  soft-tissue]
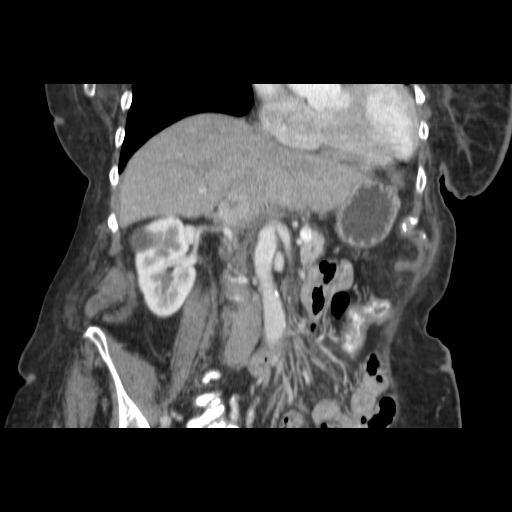
[im 61/109  soft-tissue]
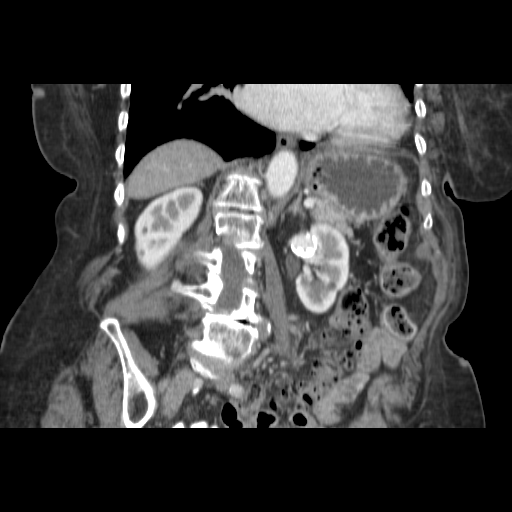

[post · axial · 0.84mm/px · z∈[-641,-395]mm · 14 of 96 slices shown, 16 images]
[im 7/96  soft-tissue]
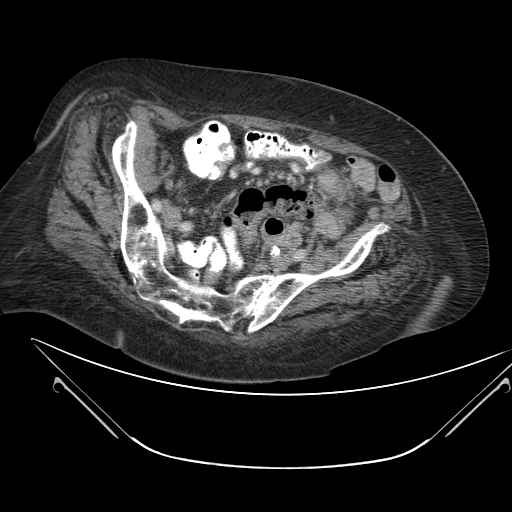
[im 7/96  bone]
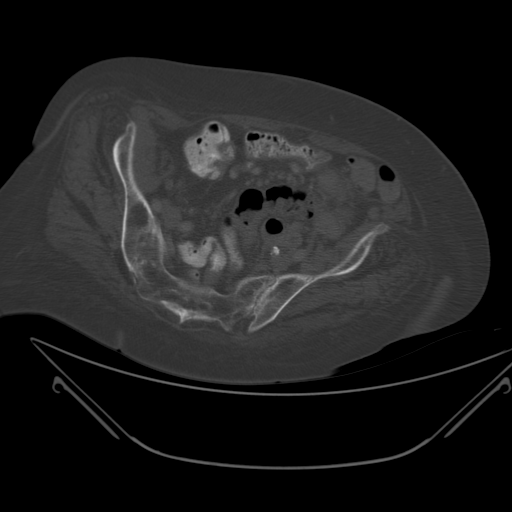
[im 13/96  soft-tissue]
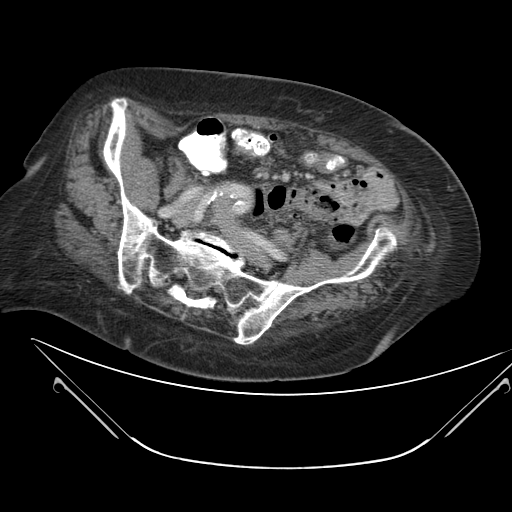
[im 20/96  soft-tissue]
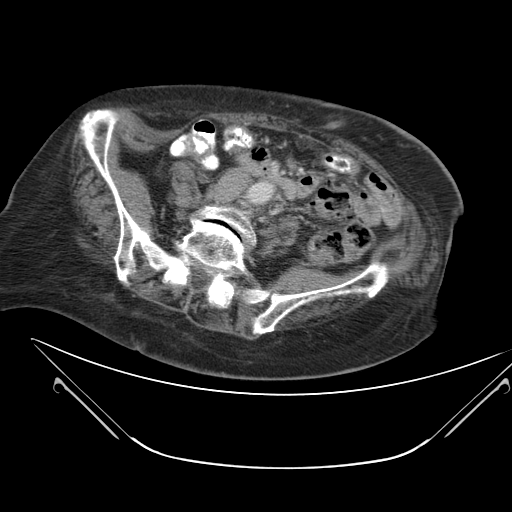
[im 26/96  soft-tissue]
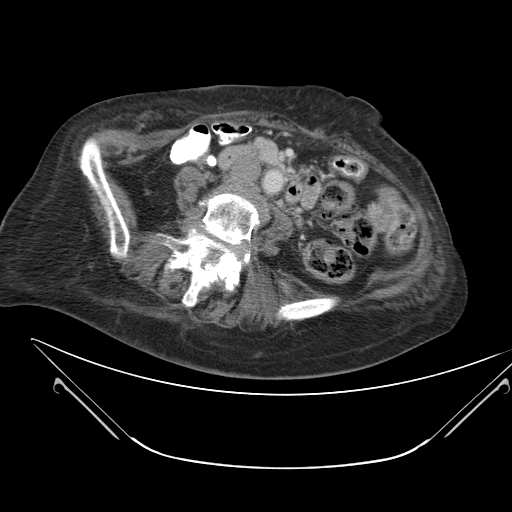
[im 32/96  soft-tissue]
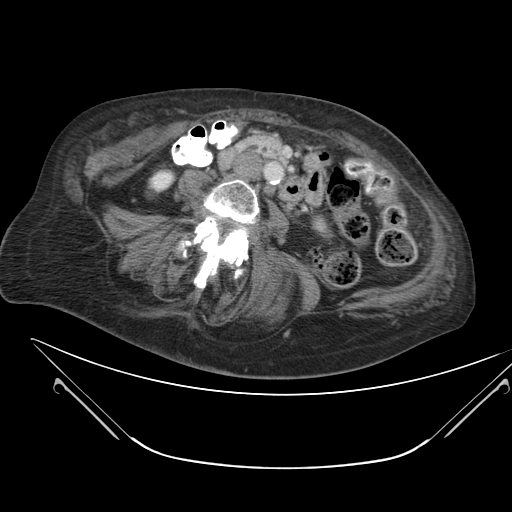
[im 39/96  soft-tissue]
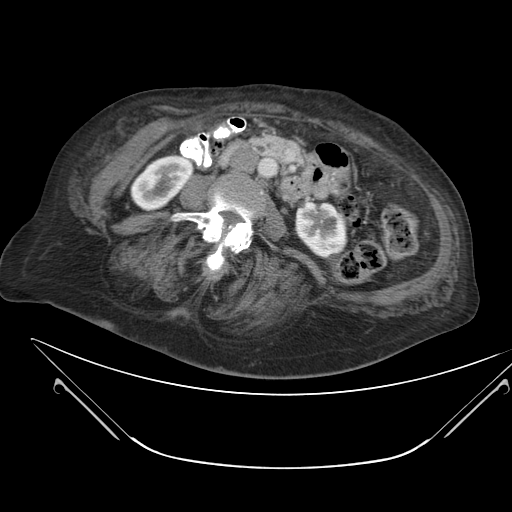
[im 45/96  soft-tissue]
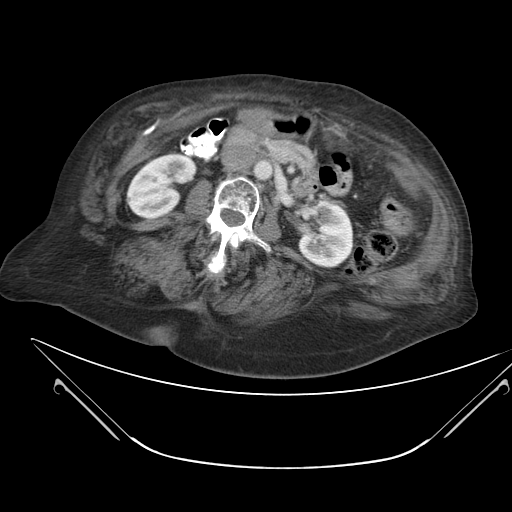
[im 51/96  soft-tissue]
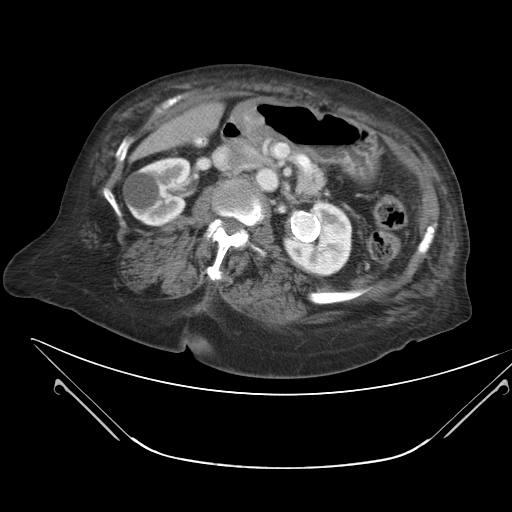
[im 58/96  soft-tissue]
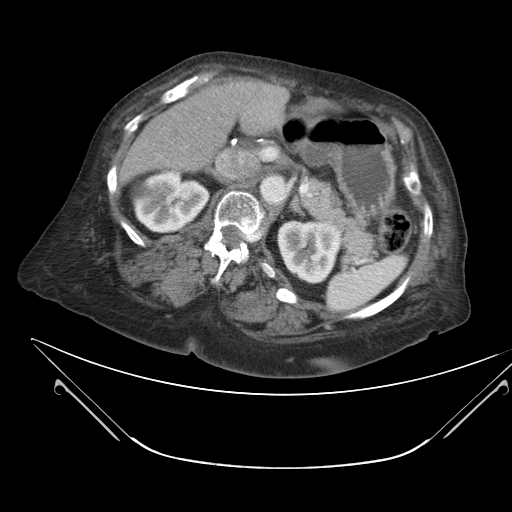
[im 58/96  bone]
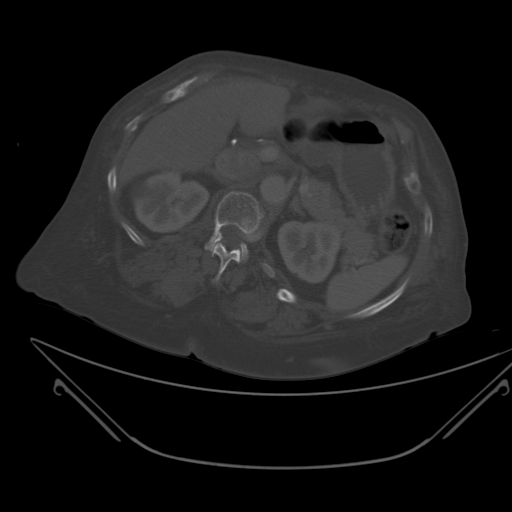
[im 64/96  soft-tissue]
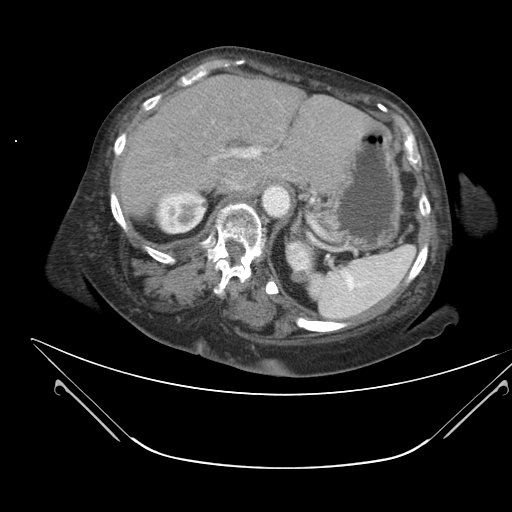
[im 70/96  soft-tissue]
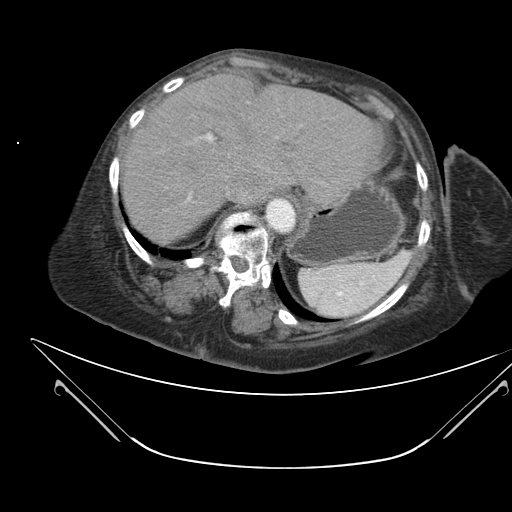
[im 77/96  soft-tissue]
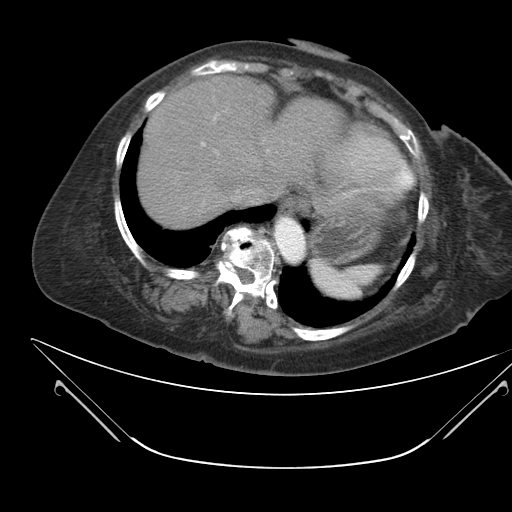
[im 83/96  soft-tissue]
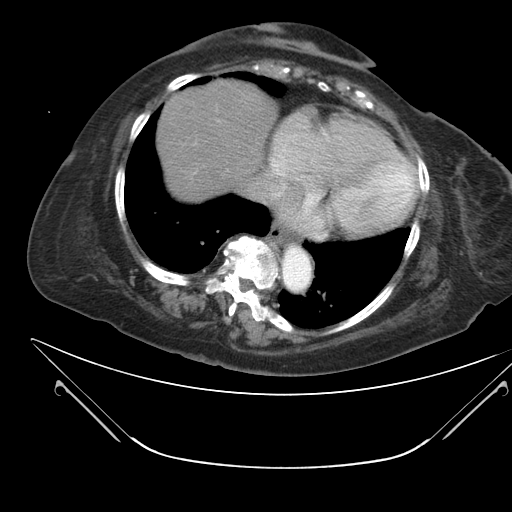
[im 89/96  soft-tissue]
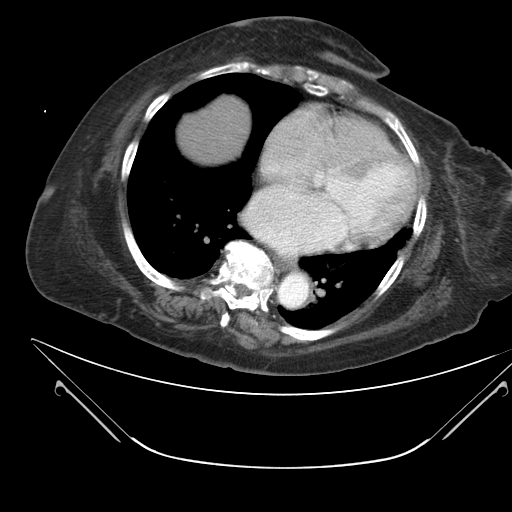

[17 of 46 positions shown; findings below may reference images not displayed]

FINDINGS: Limited visualization of lung bases is unremarkable. There is no pleural or pericardial effusion. 

There is a 2.7 x 2.5 cm heavily calcified left renal artery aneurysm at the level of the renal hilum. This corresponds to the MRI finding. There are also bilateral renal cysts, measuring up to 3.4 cm within right kidney interpolar region. No suspicious renal mass is seen on either side. There is fatty infiltration of the liver. Gallbladder is surgically absent. Spleen, pancreas and adrenal glands are normal.

Visualized bowel loops are normal in course and caliber, there is no obstruction or free air. There is no ascites or adenopathy. There are scattered vascular calcifications. Advanced multilevel arthritic changes seen throughout the visualized thoracolumbar spine.
IMPRESSION: 1. A 2.7 cm heavily calcified left renal artery aneurysm. Follow-up vascular surgery evaluation is recommended. 

2. Bilateral renal cysts. 

3. Fatty liver.

## 2022-02-02 IMAGING — US US THYROID
1 series · 14 of 25 positions shown · non-contrast
Comparison: None previous available.

﻿EXAM:  [id number]   US THYROID
INDICATION: 81-year-old with goiter.
TECHNIQUE: High-resolution ultrasound was performed with Doppler.

[Series 1: us thyroid · 14 of 71 slices shown]
[im 1/71]
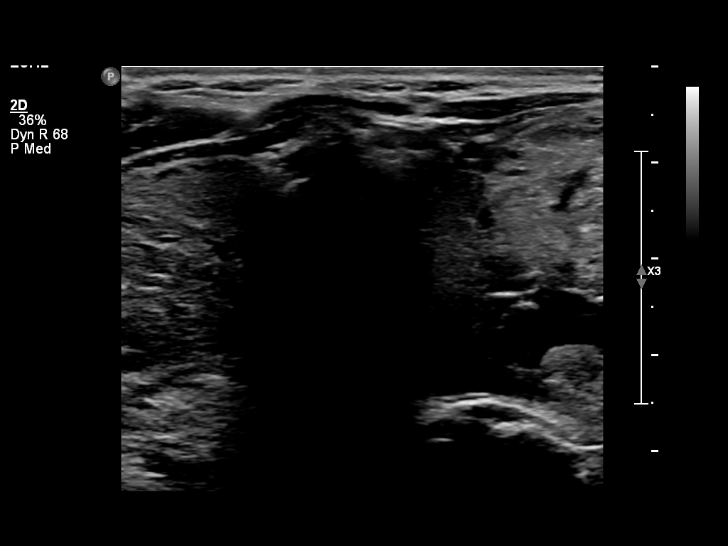
[im 6/71]
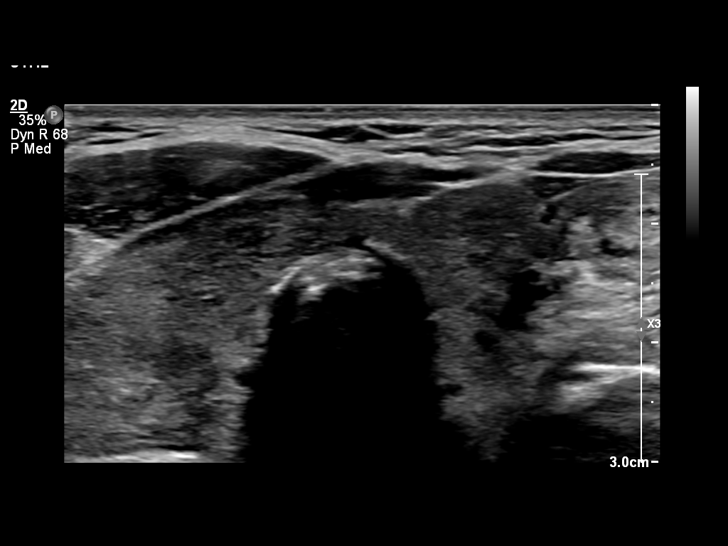
[im 12/71]
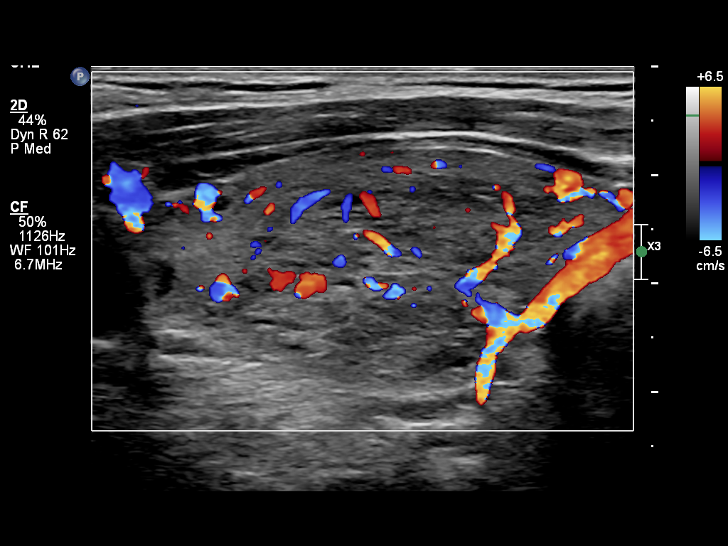
[im 18/71]
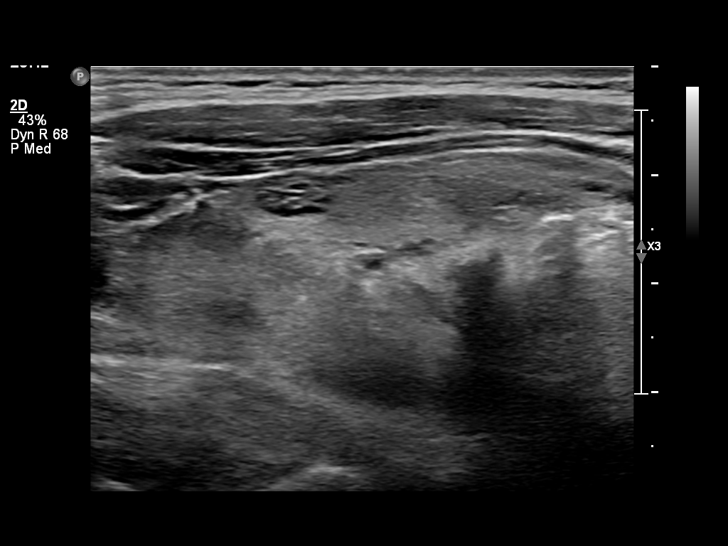
[im 24/71]
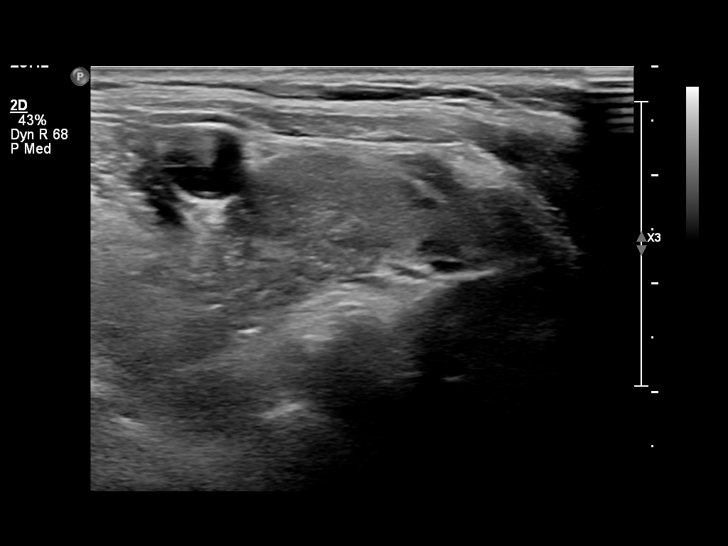
[im 27/71]
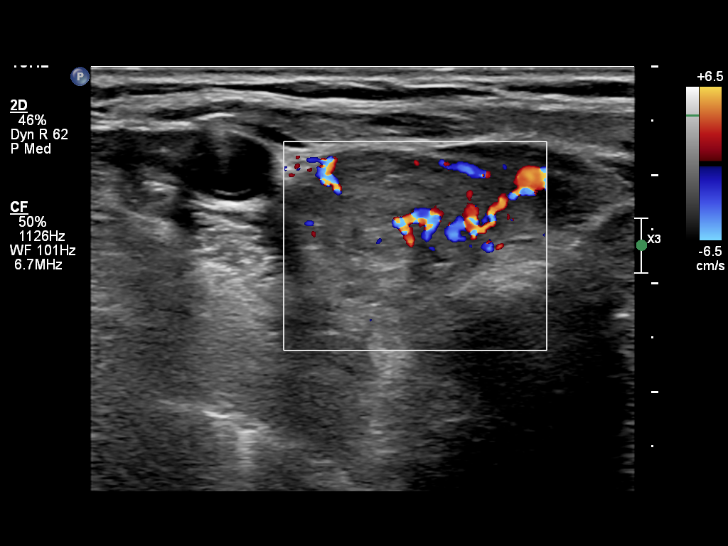
[im 33/71]
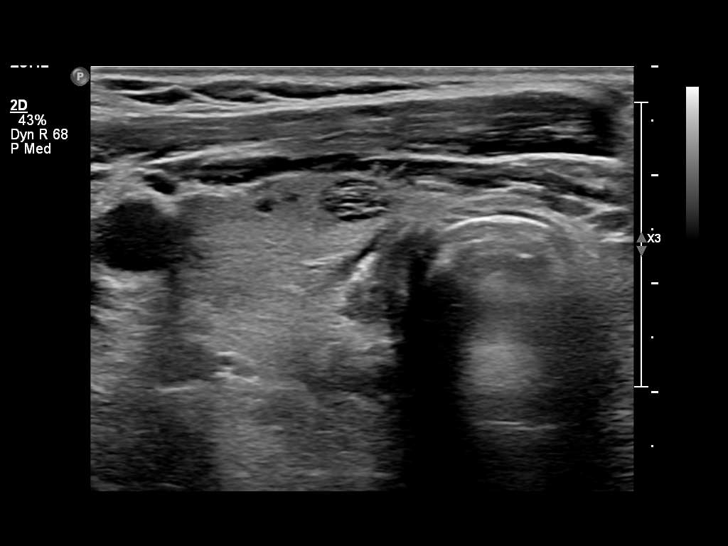
[im 38/71]
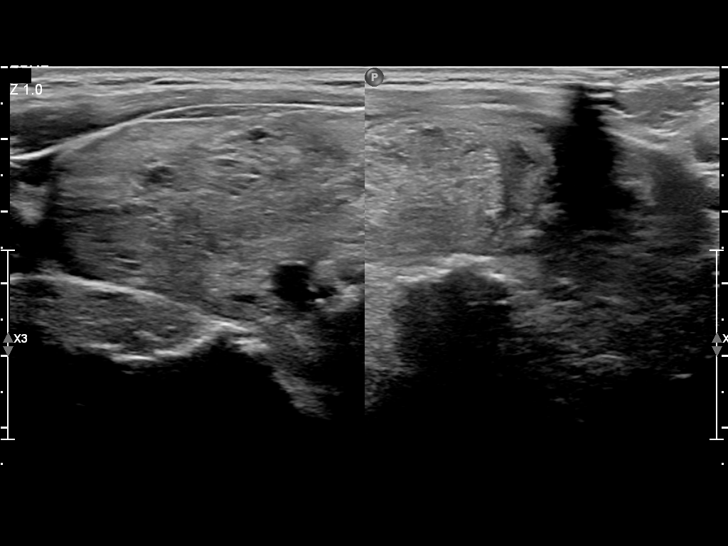
[im 44/71]
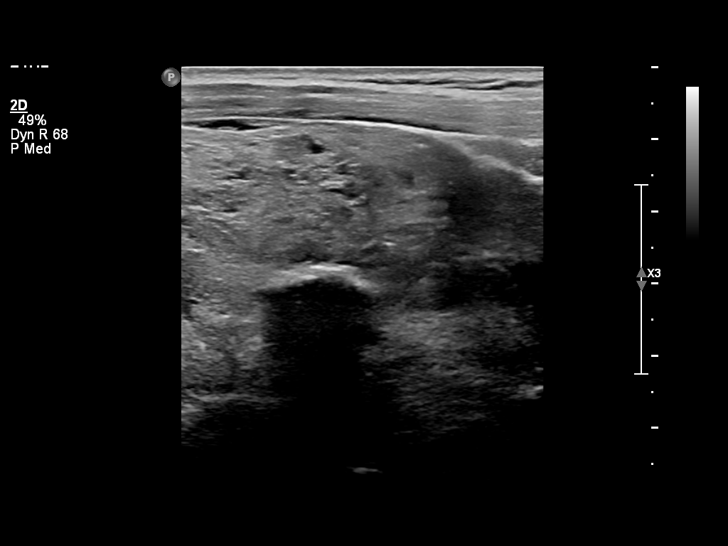
[im 47/71]
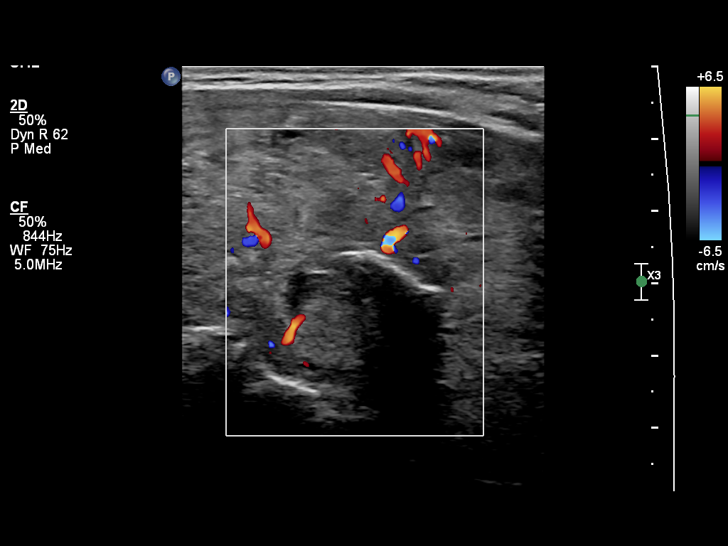
[im 53/71]
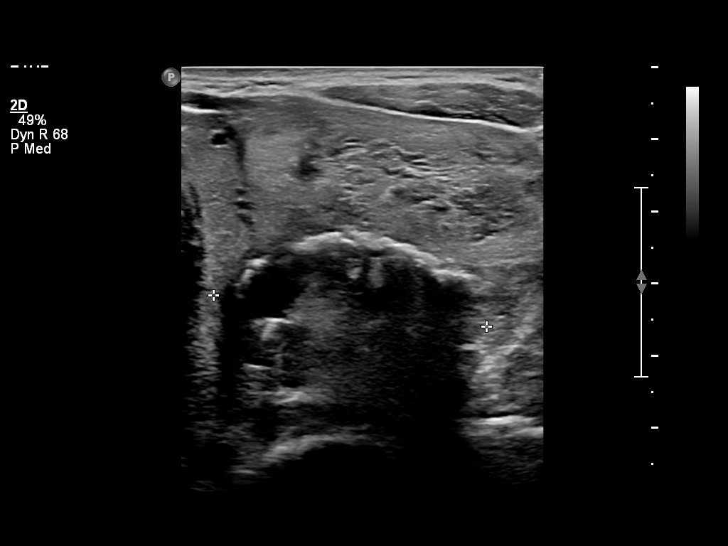
[im 59/71]
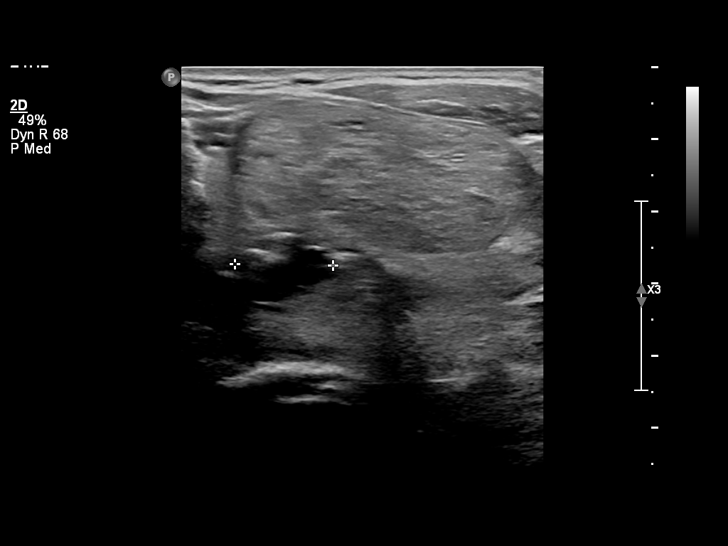
[im 65/71]
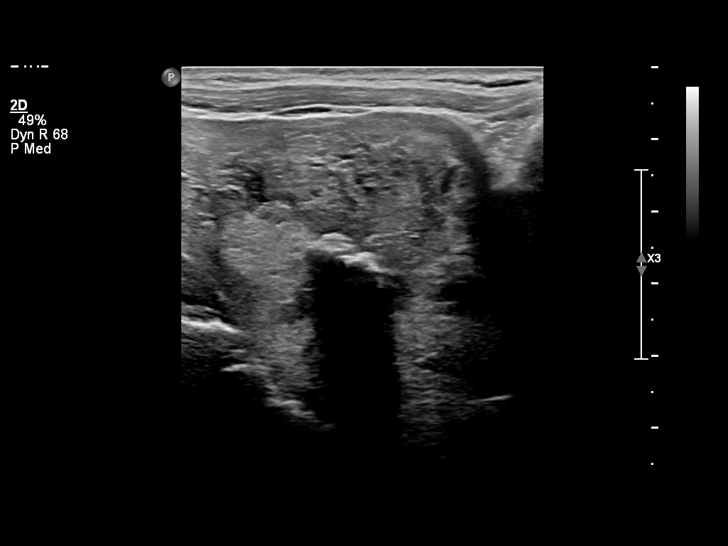
[im 71/71]
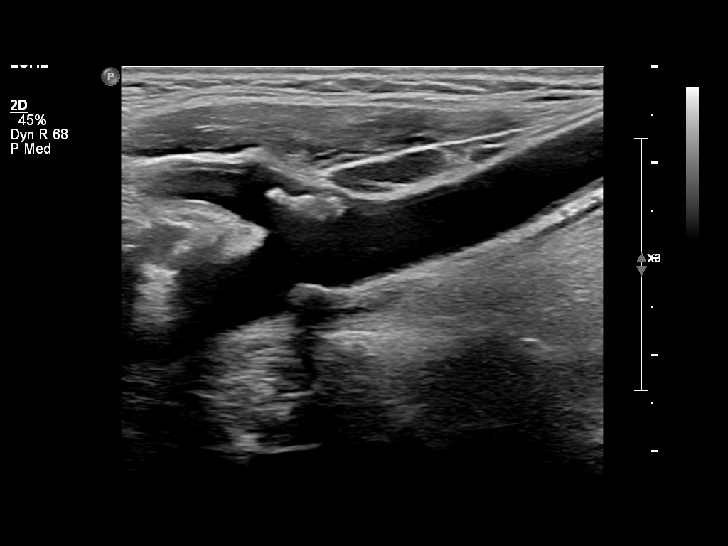

[14 of 25 positions shown; findings below may reference images not displayed]

FINDINGS: The left lobe of the thyroid gland is significantly enlarged measuring 8 x 5 x 3 cm.  Mildly enlarged right lobe measuring 5 x 2 x 2 cm.

Significantly heterogeneous echotexture of the thyroid gland is noted. Dominant, isoechoic nodule of the left lobe measures 4 x 2 cm in sagittal dimensions.  Other nodules are smaller including calcified nodule of the left lobe and cystic nodule of the left lobe. 

On the right side, dominant, isoechoic nodule measures 2.8 x 2 cm in dimensions with small cystic spaces.
IMPRESSION: 1. Enlarged thyroid gland particularly left lobe. 

2. Multiple thyroid nodules on both sides including dominant lesion 4 x 2 cm on the left and 2.8 x 2 cm on the right.  Probably benign findings. Follow-up evaluation by ultrasound in 6 months is recommended. TI-RADS category 3

Electronically Signed by GINART, SOFYY at 10-Oov-SHSW [DATE]

## 2022-05-10 ENCOUNTER — Encounter (INDEPENDENT_AMBULATORY_CARE_PROVIDER_SITE_OTHER): Payer: Self-pay | Admitting: Surgery

## 2022-05-10 ENCOUNTER — Inpatient Hospital Stay (INDEPENDENT_AMBULATORY_CARE_PROVIDER_SITE_OTHER)
Admission: RE | Admit: 2022-05-10 | Discharge: 2022-05-10 | Disposition: A | Discharge status: Home / Self Care | Payer: Medicare Other | Source: Ambulatory Visit

## 2022-05-10 ENCOUNTER — Other Ambulatory Visit (INDEPENDENT_AMBULATORY_CARE_PROVIDER_SITE_OTHER): Payer: Self-pay | Admitting: Surgery

## 2022-05-10 ENCOUNTER — Ambulatory Visit (INDEPENDENT_AMBULATORY_CARE_PROVIDER_SITE_OTHER): Payer: Medicare Other | Admitting: Surgery

## 2022-05-10 ENCOUNTER — Other Ambulatory Visit: Payer: Self-pay

## 2022-05-10 VITALS — BP 157/101 | HR 76 | Ht 60.0 in

## 2022-05-10 DIAGNOSIS — E042 Nontoxic multinodular goiter: Secondary | ICD-10-CM

## 2022-05-10 DIAGNOSIS — Z808 Family history of malignant neoplasm of other organs or systems: Secondary | ICD-10-CM

## 2022-05-10 DIAGNOSIS — E041 Nontoxic single thyroid nodule: Secondary | ICD-10-CM

## 2022-05-14 ENCOUNTER — Encounter (INDEPENDENT_AMBULATORY_CARE_PROVIDER_SITE_OTHER): Payer: Self-pay | Admitting: Surgery

## 2022-05-14 NOTE — Progress Notes (Signed)
Office visit      Reason for Visit: Thyroid    History of Present Illness  Tiffany Rojas presents  for evaluation of abnormal thyroid ultrasound.  Study revealed an enlarged gland with multiple thyroid nodules including a dominant lesion on the right.  Probable benign findings.  This was in November of 2023.  Study of January of 2024 revealed a 3 cm nodule of the left lobe measuring 3 cm given a BI-RADS 5 score.  Consider FNA.  Currently residing in a wheelchair.  Her family history of thyroid cancer.  On Eliquis.  No history of radiation to the neck.  No previous FNA.      I have reviewed the patient's provided medical records and diagnostic testing including laboratory values, imaging results, documented encounters and providers notes with all pertinent information noted with respect to today's evaluation serving as unique tests and sources as a component of the medical decision making process for this encounter relevant to the patients independent evaluation by me today.        Patient Data  Patient History  Past Medical History:   Diagnosis Date    Hypertension     Hyperthyroidism     Vitamin D deficiency          Past Surgical History:   Procedure Laterality Date    HX CHOLECYSTECTOMY      HX HYSTERECTOMY           Family Medical History:    None         Social History     Tobacco Use    Smoking status: Never    Smokeless tobacco: Never   Substance Use Topics    Alcohol use: Never        The above documented section regarding past medical, past surgical, family, and social history ([place]) has been reviewed and considered and to the best of my knowledge represents a valid and accurate reflection of the patient's previous pertinent experiences documented by multiple providers and participants of the EMR.I cannot attest to all entries but do no recognize any gross inaccuracies as the data is a common field across all providers  Further history pertinent to the current encounter will be found as  referenced       Physical Examination:    Vitals:    05/10/22 1048   BP: (!) 157/101   Pulse: 76   SpO2: 96%   Height: 1.524 m (5')        General:appropriate for age. in no acute distress.  HEENT:Atraumatic, Normocephalic.  No lymphadenopathy or thyroid nodule of the neck  Lungs:Nonlabored breathing with symmetric expansion  Heart:Regular wth respect to rate and rythmn.    Abdomen:Soft. Nontender. Nondistended  Bowel sounds are present. No peritoneal signs  Skin:No Rashes. No ulcers. Skin warm  Psychiatric:Alert and oriented to person, place, and time. affect appropriate            Diagnosis:  ENCOUNTER DIAGNOSES     ICD-10-CM   1. 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  The described nodule is not easily identified today.  Discussed options including continued observation versus FNA however now on Eliquis.  Would recommend stopping the anticoagulant prior to biopsy.  She would like to observe for now.  Follow up here in 6  months or sooner if needed.  Understands the small risk of a missed malignancy.    This note may have been partially generated using M[employer] Fluency Direct system, and there may be some incorrect words, spellings, and punctuation that were not noted in checking the note before saving, though effort was made to avoid such errors.      Reinaldo Meeker MD FACS RVT  [facility]

## 2022-10-23 IMAGING — MG 3D SCREENING MAMMO BIL AND TOMO
1 series · 5 of 5 positions shown · non-contrast
Comparison: Mammograms dated 08/14/2018, 11/04/2014.

------------- REPORT [id number] -------------
[facility]
[address]
We wish to report the following on your recent mammography examination. We are sending a report to your referring physician or other health care provider.
INDICATION: Screening mammogram.  Asymptomatic 82-year-old with no family history.  Lifetime breast cancer risk 2.8%.

[R CC tomo · right · 0.10mm/px · 5 of 5 slices shown]
[im 1/5]
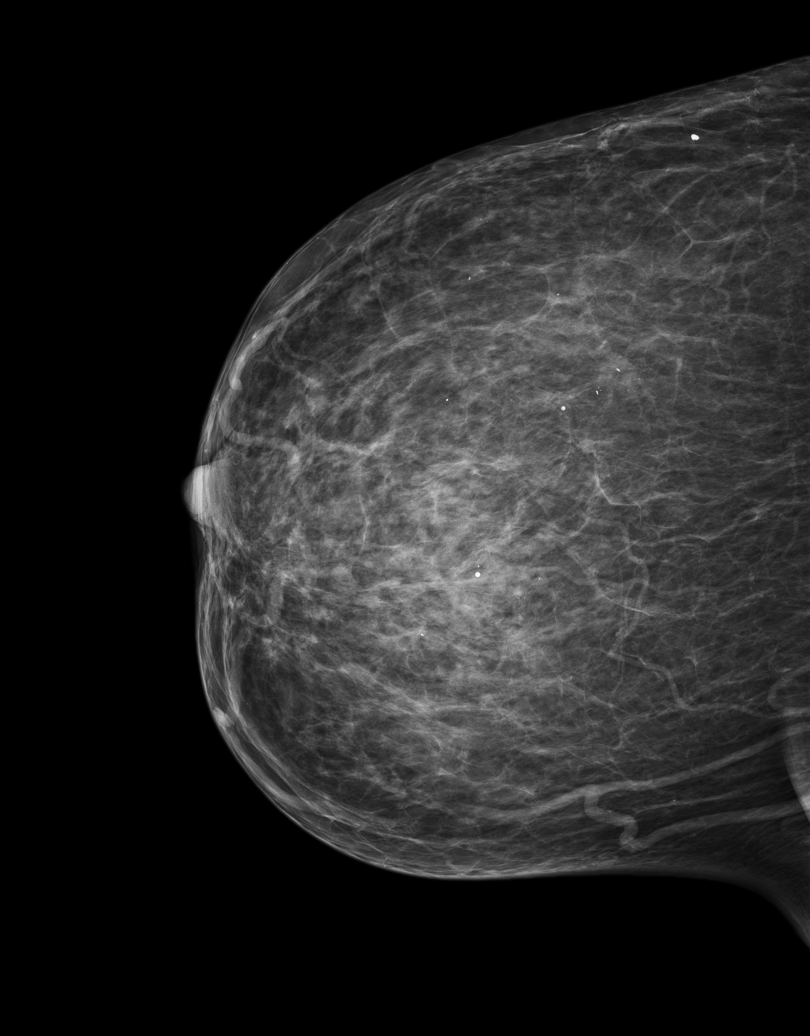
[im 2/5]
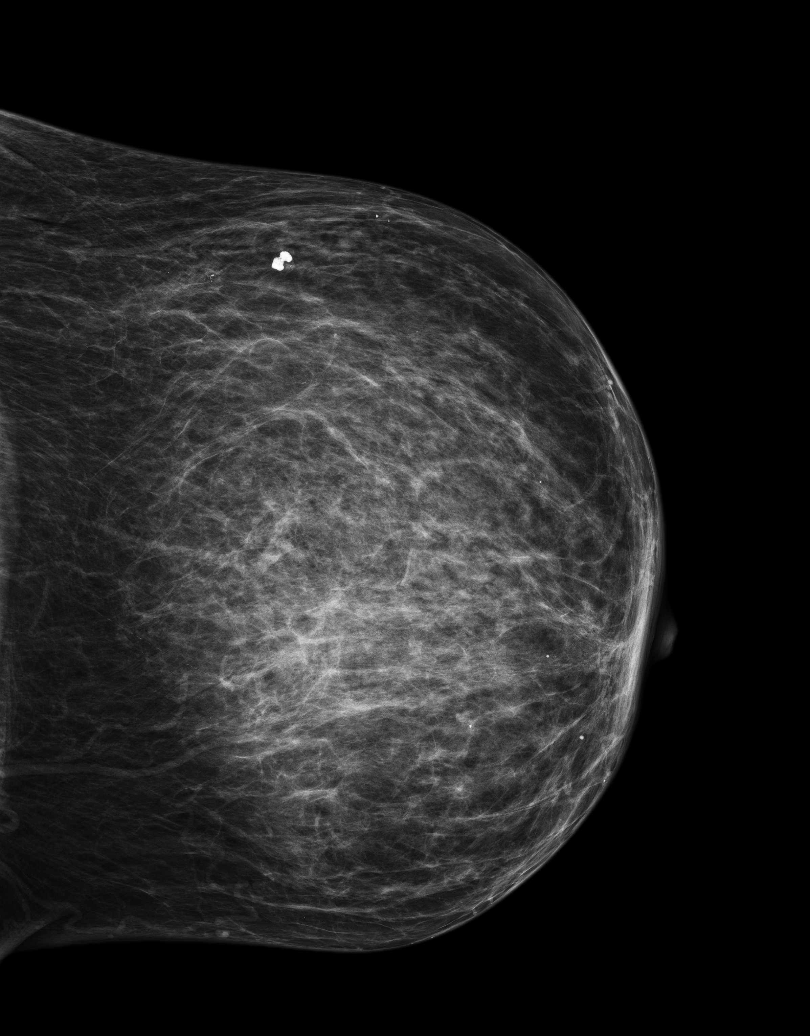
[im 3/5]
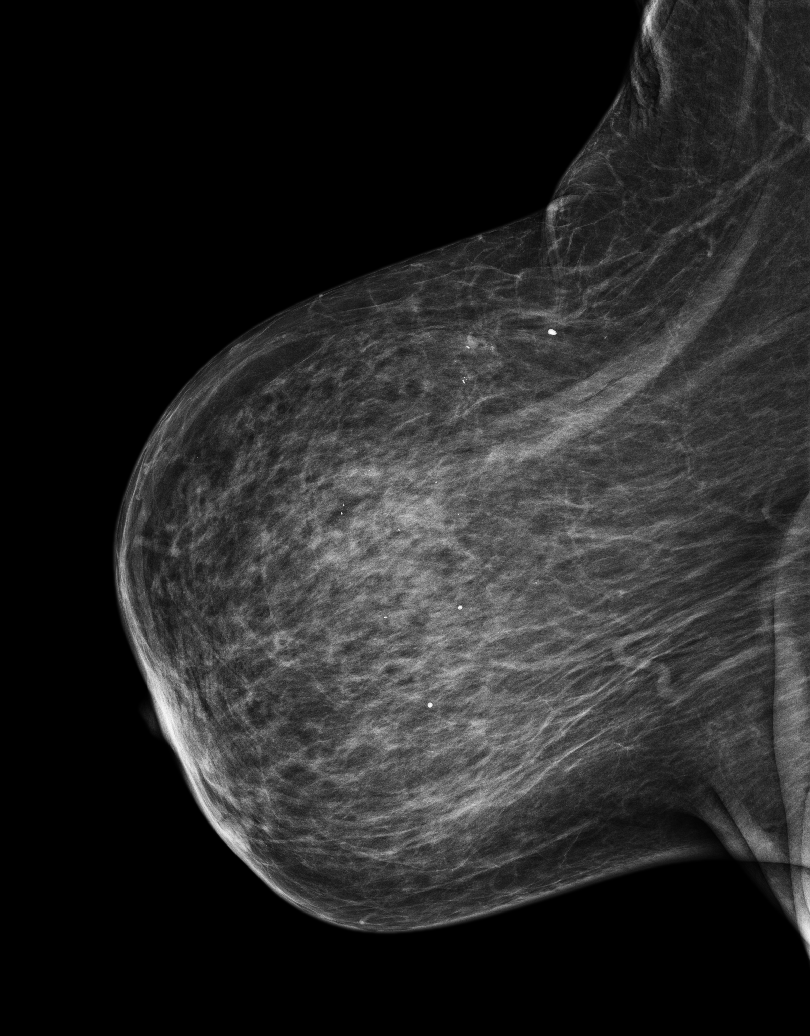
[im 4/5]
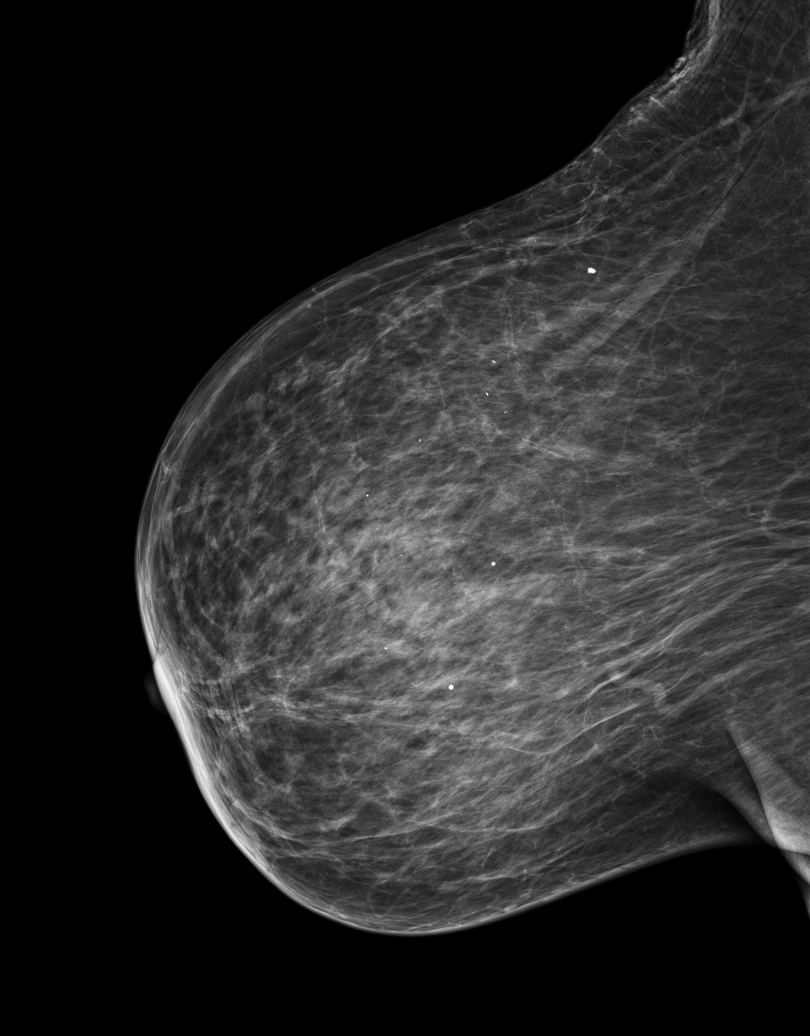
[im 5/5]
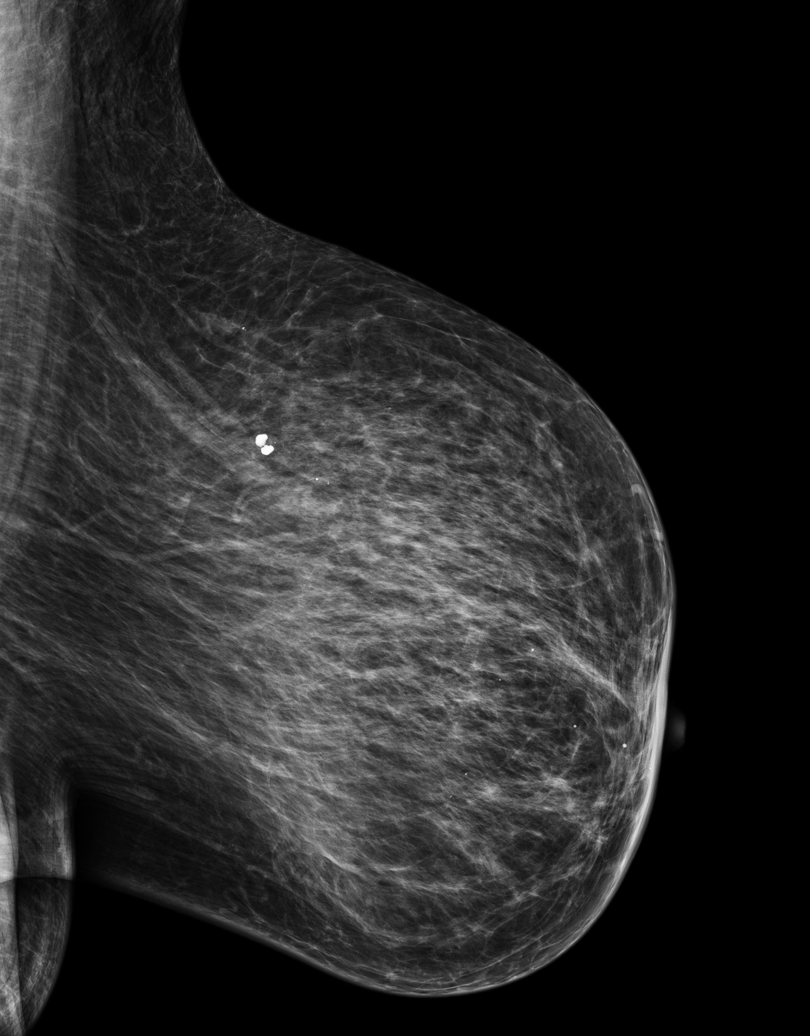

[5 of 5 positions shown; findings below may reference images not displayed]

FINDING: Normal-no evidence of cancer

This statement is mandated by the [place], Department of Health.
Your examination was performed by one of our technologists, who are registered radiological technologists and also specially certified in mammography:
___
[place], [place] (M)

Your mammogram was interpreted by our radiologist.

( 
Amore Pho, M.D.

(Annual Breast Examination by a physician or other health care provider
(Annual Mammography Screening beginning at age 40
(Monthly Breast Self Examination

------------- REPORT [id number] -------------
﻿

EXAM:  2D SCREENING MAMMO BIL
FINDINGS: Calcific foci in the right breast are stable.  No new mass or architectural changes are noted.  No abnormal calcific densities, skin changes, nipple changes or duct dilation.
IMPRESSION: 1.  BIRADS 2-Benign findings. Patient has been added in a reminder system with a target date for the next screening mammography.

2.  DENSITY CODE –  B (Scattered areas of fibroglandular density) 

Final Assessment Code:

BI-RADS 0
 Need additional imaging evaluation.

BI-RADS 1
 Negative mammogram.

BI-RADS 2
 Benign finding.

BI-RADS 3
 Probably benign finding; short-interval follow-up suggested.

BI-RADS 4
 Suspicious abnormality; biopsy should be considered.

BI-RADS 5
 Highly suggestive of malignancy; appropriate action should be taken.

BI-RADS 6
 Known biopsy-proven malignancy; appropriate action should be taken.

NOTE:
In compliance with Federal regulations, the results of this mammogram are being sent to the patient.

## 2022-11-01 ENCOUNTER — Encounter (INDEPENDENT_AMBULATORY_CARE_PROVIDER_SITE_OTHER): Payer: Self-pay | Admitting: Surgery
